# Patient Record
Sex: Female | Born: 1955 | Race: White | Hispanic: No | Marital: Single | Smoking: Never smoker
Health system: Southern US, Community
[De-identification: ages and names within clinical notes are randomized; demographics above are authoritative.]

## PROBLEM LIST (undated history)

## (undated) DIAGNOSIS — E785 Hyperlipidemia, unspecified: Secondary | ICD-10-CM

## (undated) DIAGNOSIS — E039 Hypothyroidism, unspecified: Secondary | ICD-10-CM

## (undated) DIAGNOSIS — M79671 Pain in right foot: Secondary | ICD-10-CM

## (undated) HISTORY — DX: Hypothyroidism, unspecified: E03.9

## (undated) HISTORY — DX: Hyperlipidemia, unspecified: E78.5

## (undated) HISTORY — DX: Pain in right foot: M79.671

---

## 2002-02-23 ENCOUNTER — Encounter: Admission: RE | Admit: 2002-02-23 | Discharge: 2002-05-24 | Payer: Self-pay

## 2003-05-24 ENCOUNTER — Encounter: Payer: Self-pay | Admitting: Chiropractic Medicine

## 2003-05-24 ENCOUNTER — Encounter: Admission: RE | Admit: 2003-05-24 | Discharge: 2003-05-24 | Payer: Self-pay | Admitting: Chiropractic Medicine

## 2008-12-29 ENCOUNTER — Encounter: Admission: RE | Admit: 2008-12-29 | Discharge: 2008-12-29 | Payer: Self-pay | Admitting: Family Medicine

## 2011-05-02 NOTE — Consult Note (Signed)
[place]. [facility]  Patient:    MARG, MACMASTER Visit Number: [id number] MRN: [id number]          Service Type: PMG Location: [facility] Attending Physician:  Sondra Come Dictated by:   Sondra Come, D.O. Proc. Date: 03/24/02 Admit Date:  02/23/2002   CC:         Molly Maduro P. Merla Riches, M.D.   Consultation Report  Ms. Echavarria returns to clinic today as scheduled for reevaluation.  She continues to complain of right anterior foot pain secondary to complex regional pain syndrome which is fairly well controlled with Neurontin. Patient had expressed a desire to come off the Neurontin for some mild intolerable side effects and we tried to transition her to gabitril, although she states that the gabitril did not help relieve her symptoms and she wants to stay on the Neurontin.  She asks if there are any alternatives to this and we discussed this.  I review health and history form and 14 point review of systems.  Patients pain today is a 5/10 on a subjective scale.  Function and quality of life remain essentially the same.  Sleep is fair to good.  PHYSICAL EXAMINATION  GENERAL:  Healthy female in no acute distress.  VITAL SIGNS:  Blood pressure 123/53, pulse 68, respirations 12, O2 saturation 97% on room air.  EXTREMITIES:  No changes in physical examination of the patients right foot. There is minimal tenderness to palpation bilaterally.  There is no dystrophic changes noted at this time.  IMPRESSION:  History of complex regional pain syndrome, right foot, well controlled with Neurontin.  PLAN: 1. Ms. Kuehnle and I discussed further treatment options and alternatives to    Neurontin.  Overall, she states that she is tolerating the Neurontin well    enough to continue with it and wishes to do so at this time.  In the future    consideration may be given to switching to trileptal or ______ if patient    desires.  Other alternatives could include a retrial of  acupuncture,    possibly magnet therapy but there is no proof that this would benefit her.    We also discussed topical application of ______ which would be required    three to four times per day for approximately three to four weeks to    decrease the substance P.  We could also consider local steroid injection    but patient does not desire to proceed with this at this time.  After    discussion patient wishes to continue with the Neurontin 300 mg one to two    p.o. t.i.d. and I have written her two prescriptions, one for 180 pills    which she will mail off.  This has two refills.  I have also written her    for 60 tablets without refills which she will fill today. 2. Patient to return to clinic as needed.  Ms. Crossland may follow up with her    primary care physician if she desires so to continue with the Neurontin.  Patient was educated on the above findings and recommendations and understands.  There were no barriers to communication. Dictated by:   Sondra Come, D.O. Attending Physician:  Sondra Come DD:  03/24/02 TD:  03/24/02 Job: [id number]

## 2011-05-02 NOTE — Consult Note (Signed)
[facility]  Patient:    Sylvia Pruitt, Sylvia Pruitt Visit Number: [id number] MRN: [id number]          Service Type: PMG Location: [facility] Attending Physician:  Sondra Come Dictated by:   Sondra Come, D.O. Proc. Date: 02/24/02 Admit Date:  02/23/2002   CC:         Sylvia Pruitt, M.D.   Consultation Report  NEW PATIENT CONSULTATION  REFERRING Tasheika Kitzmiller:  Sylvia Pruitt, M.D., [facility]  Dear Dr. Merla Pruitt:  Thank you very much for kindly referring Syl[facility].  The patient was seen in our clinic today.  Please refer to the following for details regarding the history, physical examination, and treatment plan.  Once again, thank you for allowing [place] to participate in the care of Sylvia Pruitt.  CHIEF COMPLAINT:  Foot pain.  HISTORY OF PRESENT ILLNESS:  Sylvia Pruitt is a pleasant 55 year old right hand dominant female with a history of complex regional pain syndrome in her right foot following a Morton neuroma resection in 1995 between her second and third metatarsal heads.  She was treated previously by Dr. ______ at [facility] and states she had two injections into her back which as she describes sound like lumbar sympathetic blocks without relief.  These were done in 1996 or 1997; she cannot remember specifically.  She has been treated with medications including Neurontin, Daypro and Procardia.  She has weaned herself from the Daypro and Procardia, and continues taking Neurontin 300 mg t.i.d. to q.i.d. with significant relief.  She states that she was on higher doses of this but has decreased secondary to weight gain.  Even at the current dose of 300 mg t.i.d. to q.i.d., she has noted some weight gain and somnolence and is not as sharp as she typically is, and wants to know if there are alternatives.  She apparently tried Topamax but cannot remember the  dose.  She said after one dose of this, she noted paresthesias and numbness in her hands bilaterally within an hour after taking it and she promptly discontinued this medicine.  This was prescribed by Dr. Loletta Parish at [facility].  Again, she is interested in some alternative medication to Neurontin.  Her pain is a 2/10 on a subjective scale with the medications.  Her function and quality of life indices are fair but sometimes she notes decreased ability to walk secondary to the burning type pain in her feet with prolonged walking.  Her symptoms are worse with walking and typically improved with medications and therapy.  Her pain is described as a burning and tingling feeling on the plantar surface and dorsal surface of her forefoot on the right.  I review health and history form, and 14-point review of systems.  PAST MEDICAL HISTORY: 1. Hypothyroidism. 2. Complex regional pain syndrome.  PAST SURGICAL HISTORY:  Morton neuroma resection.  FAMILY HISTORY:  Noncontributory.  SOCIAL HISTORY:  Denies smoking or alcohol use.  She is single and currently works.  ALLERGIES:  No known drug allergies.  CURRENT MEDICATIONS: 1. Neurontin 300 mg t.i.d. to q.i.d. 2. Synthroid.  PHYSICAL EXAMINATION:  GENERAL:  The patient is a healthy female in no acute distress.  VITAL SIGNS:  Blood pressure 128/70, pulse 67, respirations 18, O2 saturation is 99% on room air.  LOWER EXTREMITIES:  No heat, erythema or edema in the lower extremities including the feet bilaterally.  Range of  motion of the ankles and toes are full bilaterally.  There is a healed incisional scar between the second and third metatarsal heads on the right.  There is no allodynia or hyperpathia noted with light touch to the right foot.  There are no dystrophic changes noted.  NEUROLOGIC:  Manual muscle testing is 5/5 bilateral lower extremities. Sensory exam is intact to light touch bilateral lower extremities.   Muscle stretch reflexes are 2+/4 bilateral patellar, medial hamstrings, and Achilles.  Squeeze test of the right foot is normal.  IMPRESSION: 1. History of complex regional pain syndrome right foot, well controlled with    Neurontin. 2. History of hypothyroidism.  PLAN: 1. I had a thorough discussion with Sylvia Pruitt regarding treatment    alternatives.  Given her undesirable side effect profile with Neurontin,    will transition her over to Gabitril 4 mg up to three times daily.  We will    do this gradually starting with Gabitril 4 mg at bedtime while decreasing    the Neurontin dose to b.i.d.  Will follow this schedule for one to two days    and then add one tablet of Gabitril 4 mg and decreasing by one capsule of    Neurontin until the patient is on t.i.d. dosing. 2. Consider local steroid injection into right foot. 3. The patient is to return to clinic in one month or sooner as needed.  The patient was educated in the above findings and recommendations, and understands.  There were no barriers to communication. Dictated by:   Sondra Come, D.O. Attending Physician:  Sondra Come DD:  02/24/02 TD:  02/25/02 Job: [phone] [id number]

## 2011-11-19 ENCOUNTER — Ambulatory Visit (INDEPENDENT_AMBULATORY_CARE_PROVIDER_SITE_OTHER): Payer: BC Managed Care – PPO

## 2011-11-19 DIAGNOSIS — M79609 Pain in unspecified limb: Secondary | ICD-10-CM

## 2011-11-19 DIAGNOSIS — Z23 Encounter for immunization: Secondary | ICD-10-CM

## 2012-05-11 ENCOUNTER — Other Ambulatory Visit: Payer: Self-pay | Admitting: Family Medicine

## 2012-06-10 ENCOUNTER — Ambulatory Visit (INDEPENDENT_AMBULATORY_CARE_PROVIDER_SITE_OTHER): Payer: BC Managed Care – PPO | Admitting: Physician Assistant

## 2012-06-10 VITALS — BP 113/67 | HR 60 | Temp 98.1°F | Resp 16 | Ht 75.5 in | Wt 170.6 lb

## 2012-06-10 DIAGNOSIS — M79609 Pain in unspecified limb: Secondary | ICD-10-CM

## 2012-06-10 DIAGNOSIS — Z1231 Encounter for screening mammogram for malignant neoplasm of breast: Secondary | ICD-10-CM

## 2012-06-10 DIAGNOSIS — E039 Hypothyroidism, unspecified: Secondary | ICD-10-CM

## 2012-06-10 DIAGNOSIS — M79673 Pain in unspecified foot: Secondary | ICD-10-CM

## 2012-06-10 DIAGNOSIS — E785 Hyperlipidemia, unspecified: Secondary | ICD-10-CM

## 2012-06-10 LAB — COMPREHENSIVE METABOLIC PANEL
ALT: 12 U/L (ref 0–35)
AST: 16 U/L (ref 0–37)
Albumin: 4.5 g/dL (ref 3.5–5.2)
Alkaline Phosphatase: 67 U/L (ref 39–117)
BUN: 14 mg/dL (ref 6–23)
CO2: 28 mEq/L (ref 19–32)
Calcium: 9.7 mg/dL (ref 8.4–10.5)
Chloride: 106 mEq/L (ref 96–112)
Creat: 0.78 mg/dL (ref 0.50–1.10)
Glucose, Bld: 88 mg/dL (ref 70–99)
Potassium: 4.5 mEq/L (ref 3.5–5.3)
Sodium: 142 mEq/L (ref 135–145)
Total Bilirubin: 0.7 mg/dL (ref 0.3–1.2)
Total Protein: 7 g/dL (ref 6.0–8.3)

## 2012-06-10 LAB — LIPID PANEL
Cholesterol: 226 mg/dL — ABNORMAL HIGH (ref 0–200)
HDL: 54 mg/dL (ref >39)
LDL Cholesterol: 160 mg/dL — ABNORMAL HIGH (ref 0–99)
Total CHOL/HDL Ratio: 4.2 Ratio
Triglycerides: 58 mg/dL (ref <150)
VLDL: 12 mg/dL (ref 0–40)

## 2012-06-10 LAB — TSH: TSH: 0.575 u[IU]/mL (ref 0.350–4.500)

## 2012-06-10 MED ORDER — LEVOTHYROXINE SODIUM 75 MCG PO TABS: 75.0000 ug | ORAL_TABLET | Freq: Every day | ORAL | Status: DC

## 2012-06-10 MED ORDER — GABAPENTIN 300 MG PO CAPS: 600.0000 mg | ORAL_CAPSULE | Freq: Four times a day (QID) | ORAL | Status: DC

## 2012-06-10 MED ORDER — TRAMADOL HCL 50 MG PO TABS: 50.0000 mg | ORAL_TABLET | Freq: Three times a day (TID) | ORAL | Status: AC | PRN

## 2012-06-10 NOTE — Progress Notes (Signed)
Patient ID: Sylvia Pruitt MRN: [id number], DOB: March 17, 1956, 56 y.o. Date of Encounter: 06/10/2012, 10:28 AM  Primary Physician: No primary provider on file.  Chief Complaint: Medication refill and lab work  HPI: 56 y.o. year old female with history below presents for medication refill and labs.  1) Hypothyroidism: Doing well. No issues or complaints. Tolerating medicine without issues. Takes daily. Not currently out. Needs refills.  2) Elevated lipids: Mildly elevated fasting lipid panel 10/27/11. Total cholesterol 202, Triglycerides 67, HDL 55, LDL 134. Eats a healthy diet, but she is unable to name anything in office. Regular exercise 5 times per week for 30-50 minutes. Not taking fish oil.   3) Chronic neuropathic right foot pain: Doing well with her Neurontin 300 mg 2 tabs qid. Does have some break through pain just lateral to her Morton's Neuroma excision site. Has seen a podiatrist about 6 month ago and was placed in a boot. This helped, but since she has been out of it her pain has returned. She is not interested in having any procedure done for this.     Past Medical History  Diagnosis Date  . Hypothyroid   . Foot pain, right   . Elevated lipids      Home Meds: Prior to Admission medications   Medication Sig Start Date End Date Taking? Authorizing Provider  gabapentin (NEURONTIN) 300 MG capsule Take 2 capsules (600 mg total) by mouth 4 (four) times daily. 06/10/12  Yes Orell Hurtado M Karmin Kasprzak, PA-C  levothyroxine (SYNTHROID, LEVOTHROID) 75 MCG tablet Take 1 tablet (75 mcg total) by mouth daily. 06/10/12  Yes Gabrial Poppell M Adilynne Fitzwater, PA-C           Allergies: No Known Allergies  History   Social History  . Marital Status: Single    Spouse Name: N/A    Number of Children: N/A  . Years of Education: N/A   Occupational History  . Not on file.   Social History Main Topics  . Smoking status: Never Smoker   . Smokeless tobacco: Not on file  . Alcohol Use: Not on file  . Drug Use: Not on file    . Sexually Active: Not on file   Other Topics Concern  . Not on file   Social History Narrative  . No narrative on file     Review of Systems: Constitutional: negative for chills, fever, night sweats, weight changes, or fatigue  HEENT: negative for vision changes, hearing loss, congestion, rhinorrhea, ST, epistaxis, or sinus pressure Cardiovascular: negative for chest pain or palpitations Respiratory: negative for hemoptysis, wheezing, shortness of breath, or cough Abdominal: negative for abdominal pain, nausea, vomiting, diarrhea, or constipation Dermatological: negative for rash Neurologic: negative for headache, dizziness, or syncope All other systems reviewed and are otherwise negative with the exception to those above and in the HPI.   Physical Exam: Blood pressure 113/67, pulse 60, temperature 98.1 F (36.7 C), temperature source Oral, resp. rate 16, height 6' 3.5" (1.918 m), weight 170 lb 9.6 oz (77.384 kg), SpO2 97.00%., Body mass index is 21.04 kg/(m^2). General: Well developed, well nourished, in no acute distress. Head: Normocephalic, atraumatic, eyes without discharge, sclera non-icteric, nares are without discharge. Bilateral auditory canals clear, TM's are without perforation, pearly grey and translucent with reflective cone of light bilaterally. Oral cavity moist, posterior pharynx without exudate, erythema, peritonsillar abscess, or post nasal drip.  Neck: Supple. No thyromegaly. Full ROM. No lymphadenopathy. Lungs: Clear bilaterally to auscultation without wheezes, rales, or rhonchi. Breathing is  unlabored. Heart: RRR with S1 S2. No murmurs, rubs, or gallops appreciated. Msk:  Strength and tone normal for age. Extremities/Skin: Warm and dry. No clubbing or cyanosis. No edema. No rashes or suspicious lesions. Neuro: Alert and oriented X 3. Moves all extremities spontaneously. Gait is normal. CNII-XII grossly in tact. Psych:  Responds to questions appropriately with a  normal affect.   Labs: CMP, Lipid, and TSH all pending. Patient is fasting.  ASSESSMENT AND PLAN:  56 y.o. year old female with hypothyroidism, elevated lipids, and chronic neuropathic pain of the right foot. 1. Hypothyroidism -Refilled Synthroid 75 mcg 1 po daily #90 RF 3 -Await labs  2. Elevated Cholesterol -Await labs, if remains elevated likely start statin -Continue healthy diet and exercise -Start fish oil  3. Neuropathic foot pain -Chronic issue -Does well with the Neurontin -Refilled Neurontin 300 mg Take 2 tabs qid #720 RF 3 -Trial of Ultram 50 mg 1 po tid prn for break through pain RF 0 -Call with update -If does not improve, may need to go back to the podiatrist   4. MMG -Requests referral for MMG as I walk out of the room completing our visit today -Referral made for screening MMG -She is uncertain when her last MMG was -Does not perform SBE -No family history of breast cancer  Signed, Eula Listen, PA-C 06/10/2012 10:28 AM

## 2012-06-11 ENCOUNTER — Other Ambulatory Visit: Payer: Self-pay | Admitting: Physician Assistant

## 2012-06-11 MED ORDER — ATORVASTATIN CALCIUM 20 MG PO TABS: 20.0000 mg | ORAL_TABLET | Freq: Every day | ORAL | Status: DC

## 2012-06-22 ENCOUNTER — Telehealth: Payer: Self-pay

## 2012-06-22 NOTE — Telephone Encounter (Signed)
PT WOULD LIKE TO COME AND PICK UP A COPY OF HER LABS FROM 06/10/12, PT STATES THAT LABS WERE SUPPOSED TO BE MAILED BUT SHE HAS NOT YET RECEIVED THEM. [phone]

## 2012-06-22 NOTE — Telephone Encounter (Signed)
Done copy is in the pick up drawer per patients request.   Pt is aware that records are ready.

## 2012-07-13 ENCOUNTER — Ambulatory Visit
Admission: RE | Admit: 2012-07-13 | Discharge: 2012-07-13 | Disposition: A | Discharge status: Home / Self Care | Payer: BC Managed Care – PPO | Source: Ambulatory Visit | Attending: Physician Assistant | Admitting: Physician Assistant

## 2012-07-13 ENCOUNTER — Ambulatory Visit: Payer: Self-pay

## 2012-07-13 DIAGNOSIS — Z1231 Encounter for screening mammogram for malignant neoplasm of breast: Secondary | ICD-10-CM

## 2012-07-27 ENCOUNTER — Ambulatory Visit: Payer: Self-pay

## 2012-11-21 ENCOUNTER — Other Ambulatory Visit: Payer: Self-pay | Admitting: Family Medicine

## 2013-03-13 ENCOUNTER — Other Ambulatory Visit: Payer: Self-pay | Admitting: Physician Assistant

## 2013-03-14 ENCOUNTER — Other Ambulatory Visit: Payer: Self-pay | Admitting: Physician Assistant

## 2013-03-29 ENCOUNTER — Ambulatory Visit (INDEPENDENT_AMBULATORY_CARE_PROVIDER_SITE_OTHER): Payer: BC Managed Care – PPO | Admitting: Family Medicine

## 2013-03-29 VITALS — BP 122/76 | HR 49 | Temp 98.2°F | Resp 16 | Ht 71.0 in | Wt 164.0 lb

## 2013-03-29 DIAGNOSIS — M25519 Pain in unspecified shoulder: Secondary | ICD-10-CM

## 2013-03-29 DIAGNOSIS — D1739 Benign lipomatous neoplasm of skin and subcutaneous tissue of other sites: Secondary | ICD-10-CM

## 2013-03-29 DIAGNOSIS — M25511 Pain in right shoulder: Secondary | ICD-10-CM

## 2013-03-29 DIAGNOSIS — G90529 Complex regional pain syndrome I of unspecified lower limb: Secondary | ICD-10-CM | POA: Insufficient documentation

## 2013-03-29 DIAGNOSIS — E039 Hypothyroidism, unspecified: Secondary | ICD-10-CM

## 2013-03-29 DIAGNOSIS — D171 Benign lipomatous neoplasm of skin and subcutaneous tissue of trunk: Secondary | ICD-10-CM

## 2013-03-29 DIAGNOSIS — E785 Hyperlipidemia, unspecified: Secondary | ICD-10-CM

## 2013-03-29 DIAGNOSIS — G90521 Complex regional pain syndrome I of right lower limb: Secondary | ICD-10-CM

## 2013-03-29 LAB — COMPREHENSIVE METABOLIC PANEL
ALT: 13 U/L (ref 0–35)
AST: 15 U/L (ref 0–37)
Albumin: 3.8 g/dL (ref 3.5–5.2)
Alkaline Phosphatase: 64 U/L (ref 39–117)
BUN: 13 mg/dL (ref 6–23)
CO2: 27 mEq/L (ref 19–32)
Calcium: 9.1 mg/dL (ref 8.4–10.5)
Chloride: 108 mEq/L (ref 96–112)
Creat: 0.73 mg/dL (ref 0.50–1.10)
Glucose, Bld: 94 mg/dL (ref 70–99)
Potassium: 4.1 mEq/L (ref 3.5–5.3)
Sodium: 141 mEq/L (ref 135–145)
Total Bilirubin: 0.6 mg/dL (ref 0.3–1.2)
Total Protein: 6.4 g/dL (ref 6.0–8.3)

## 2013-03-29 LAB — LIPID PANEL
Cholesterol: 121 mg/dL (ref 0–200)
HDL: 44 mg/dL (ref >39)
LDL Cholesterol: 62 mg/dL (ref 0–99)
Total CHOL/HDL Ratio: 2.8 Ratio
Triglycerides: 77 mg/dL (ref <150)
VLDL: 15 mg/dL (ref 0–40)

## 2013-03-29 LAB — TSH: TSH: 0.225 u[IU]/mL — ABNORMAL LOW (ref 0.350–4.500)

## 2013-03-29 MED ORDER — MELOXICAM 15 MG PO TABS: 15.0000 mg | ORAL_TABLET | Freq: Every day | ORAL | Status: DC

## 2013-03-29 MED ORDER — ATORVASTATIN CALCIUM 20 MG PO TABS: 20.0000 mg | ORAL_TABLET | Freq: Every day | ORAL | Status: DC

## 2013-03-29 MED ORDER — GABAPENTIN 300 MG PO CAPS: 600.0000 mg | ORAL_CAPSULE | Freq: Three times a day (TID) | ORAL | Status: DC

## 2013-03-29 MED ORDER — LEVOTHYROXINE SODIUM 75 MCG PO TABS: 75.0000 ug | ORAL_TABLET | Freq: Every day | ORAL | Status: DC

## 2013-03-29 NOTE — Patient Instructions (Signed)
Take your medications as directed  If you decide you wish to be referred to a surgeon for the lipoma let me know  Return in one year or sooner if needed  Continue getting exercise

## 2013-03-29 NOTE — Progress Notes (Signed)
Subjective: 57 year old lady who is here for a routine refilling of her medications and to discuss several items. She is on medicines for hypothyroidism, which she's been on for 20 something years. 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 The gabapentin does help to control this. She also has been has problems with pain in her shoulder with marked internal and external rotation it hurts her in the biceps and triceps regions. It doesn't really limit her lifestyle, but it does hurt. The physical therapist has given her some exercises which have helped some. She has just started taking this summer for an old prescription of MOBIC for this. She also has been diagnosed with hyperlipidemia a year ago was placed on Lipitor 20 daily. She has not been back for followup labs since that time. Lastly she has a lipoma on her left flank it she has known about for a long time. Sometime ago she was told these could be removed, and she feels like it has grown a little bit.  Objective: Pleasant alert lady in no major distress. Neck supple without nodes thyromegaly. Chest is clear to auscultation. Heart regular without murmurs. No carotid bruits. She has a lipoma on her left lateral low chest wall, below and lateral to the breast, and that is soft, readily movable, 5 cm or so in diameter.  Assessment: Hypothyroidism Hyperlipidemia Right shoulder/upper arm pains Lipoma left flank Reflex sympathetic dystrophy and pain right foot secondary to old Morton's neuroma  Plan: Resume using the Mobic. Use it continuously for 2 or 3 weeks, then just on when necessary basis and see how the shoulder does. If he keeps bothering her a lot we can get an orthopedist or sports medicine physician to look at it.  Recommend continuing the cholesterol lower medication. Will also keep her on the same dose of thyroid medicine for now.  I  would recommend just living with a lipoma, but if he gets more concerned troubles her she just wants to get rid of it we can refer her to a general surgeon. This could be done in the office setting here, but it is a large for morning to do that in case there complications.

## 2013-07-18 ENCOUNTER — Ambulatory Visit (INDEPENDENT_AMBULATORY_CARE_PROVIDER_SITE_OTHER): Payer: BC Managed Care – PPO | Admitting: Emergency Medicine

## 2013-07-18 VITALS — BP 116/68 | HR 56 | Temp 97.4°F | Resp 18 | Ht 71.0 in | Wt 162.0 lb

## 2013-07-18 DIAGNOSIS — M25511 Pain in right shoulder: Secondary | ICD-10-CM

## 2013-07-18 DIAGNOSIS — G90521 Complex regional pain syndrome I of right lower limb: Secondary | ICD-10-CM

## 2013-07-18 DIAGNOSIS — G90529 Complex regional pain syndrome I of unspecified lower limb: Secondary | ICD-10-CM

## 2013-07-18 DIAGNOSIS — M79609 Pain in unspecified limb: Secondary | ICD-10-CM

## 2013-07-18 DIAGNOSIS — M79671 Pain in right foot: Secondary | ICD-10-CM

## 2013-07-18 MED ORDER — MELOXICAM 15 MG PO TABS: 15.0000 mg | ORAL_TABLET | Freq: Every day | ORAL | Status: DC

## 2013-07-18 MED ORDER — GABAPENTIN 300 MG PO CAPS: 600.0000 mg | ORAL_CAPSULE | Freq: Four times a day (QID) | ORAL | Status: DC

## 2013-07-18 MED ORDER — TRAMADOL HCL 50 MG PO TABS: 50.0000 mg | ORAL_TABLET | Freq: Three times a day (TID) | ORAL | Status: DC | PRN

## 2013-07-18 NOTE — Progress Notes (Signed)
[facility] [address], [place] [address] [phone]  Date:  07/18/2013   Name:  Sylvia Pruitt   DOB:  09-27-1956   MRN:  [id number]  PCP:  No primary provider on file.    Chief Complaint: Foot Pain   History of Present Illness:  Sylvia Pruitt is a 57 y.o. very pleasant female patient who presents with the following:  20 year history of painful foot.  Had surgery for mortons neuroma and developed RSD.  Now has chronic pain and newer pain from an inflammation of the fourth MTP joint. No history of injury or overuse.  Has been taking an old prescription for NSAID and tramadol in addition to gabapentin with moderate relief.  Wants referral to ortho.  No improvement with over the counter medications or other home remedies. Denies other complaint or health concern today.   Patient Active Problem List   Diagnosis Date Noted  . 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 Hypothyroid 03/29/2013  . 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 Other and unspecified hyperlipidemia 03/29/2013    Past Medical History  Diagnosis Date  . Hypothyroid   . Foot pain, right   . Elevated lipids     History reviewed. No pertinent past surgical history.  History  Substance Use Topics  . Smoking status: Never Smoker   . Smokeless tobacco: Not on file  . Alcohol Use: No    Family History  Problem Relation Age of Onset  . Heart disease Father     No Known Allergies  Medication list has been reviewed and updated.  Current Outpatient Prescriptions on File Prior to Visit  Medication Sig Dispense Refill  . atorvastatin (LIPITOR) 20 MG tablet Take 1 tablet (20 mg total) by mouth daily.  90 tablet  3  . gabapentin (NEURONTIN) 300 MG capsule Take 2 capsules (600 mg total) by mouth 3 (three) times daily.  540 capsule  3  . levothyroxine (SYNTHROID, LEVOTHROID) 75 MCG tablet Take 1 tablet (75 mcg total) by mouth daily.  90 tablet  3  . meloxicam (MOBIC) 15 MG tablet Take 1 tablet (15 mg total)  by mouth daily.  30 tablet  1   No current facility-administered medications on file prior to visit.    Review of Systems:  As per HPI, otherwise negative.    Physical Examination: Filed Vitals:   07/18/13 0839  BP: 116/68  Pulse: 56  Temp: 97.4 F (36.3 C)  Resp: 18   Filed Vitals:   07/18/13 0839  Height: 5\' 11"  (1.803 m)  Weight: 162 lb (73.483 kg)   Body mass index is 22.6 kg/(m^2). Ideal Body Weight: Weight in (lb) to have BMI = 25: 178.9   GEN: WDWN, NAD, Non-toxic, Alert & Oriented x 3 HEENT: Atraumatic, Normocephalic.  Ears and Nose: No external deformity. EXTR: No clubbing/cyanosis/edema NEURO: Normal gait.  PSYCH: Normally interactive. Conversant. Not depressed or anxious appearing.  Calm demeanor.  RIGHT foot:  Surgical scar between second and third toes.  Tender fourth MTP joint.  No deformity or ecchymosis.  Assessment and Plan: RSD Refill mobic Ortho referral  Signed,  Phillips Odor, MD

## 2013-11-09 ENCOUNTER — Ambulatory Visit (INDEPENDENT_AMBULATORY_CARE_PROVIDER_SITE_OTHER): Payer: BC Managed Care – PPO | Admitting: Family Medicine

## 2013-11-09 VITALS — BP 110/68 | HR 70 | Temp 98.4°F | Resp 18 | Ht 71.0 in | Wt 165.0 lb

## 2013-11-09 DIAGNOSIS — N390 Urinary tract infection, site not specified: Secondary | ICD-10-CM

## 2013-11-09 DIAGNOSIS — N760 Acute vaginitis: Secondary | ICD-10-CM

## 2013-11-09 DIAGNOSIS — R32 Unspecified urinary incontinence: Secondary | ICD-10-CM

## 2013-11-09 LAB — POCT UA - MICROSCOPIC ONLY
Casts, Ur, LPF, POC: NEGATIVE
Crystals, Ur, HPF, POC: NEGATIVE
Mucus, UA: POSITIVE
RBC, urine, microscopic: NEGATIVE
WBC, Ur, HPF, POC: NEGATIVE
Yeast, UA: POSITIVE

## 2013-11-09 LAB — POCT URINALYSIS DIPSTICK
Bilirubin, UA: NEGATIVE
Blood, UA: NEGATIVE
Glucose, UA: NEGATIVE
Ketones, UA: NEGATIVE
Leukocytes, UA: NEGATIVE
Nitrite, UA: NEGATIVE
Protein, UA: NEGATIVE
Spec Grav, UA: 1.025
Urobilinogen, UA: 0.2
pH, UA: 5.5

## 2013-11-09 LAB — POCT WET PREP WITH KOH
KOH Prep POC: POSITIVE
RBC Wet Prep HPF POC: NEGATIVE
Trichomonas, UA: NEGATIVE
Yeast Wet Prep HPF POC: NEGATIVE

## 2013-11-09 MED ORDER — FLUCONAZOLE 100 MG PO TABS: 100.0000 mg | ORAL_TABLET | Freq: Every day | ORAL | Status: DC

## 2013-11-09 NOTE — Patient Instructions (Signed)
Diflucan 100 mg daily for 3 days  Return if not improving  Drink plenty of fluids

## 2013-11-09 NOTE — Progress Notes (Signed)
Subjective:  Subjective: 57 year old lady who's been having some pelvic discomfort over the last 5 or 6 days with a sensation of pain when sitting in certain positions. The last couple days she's felt like she was not voiding adequately. She has had a yeast infection some time ago in the past, and she had taken antibiotics, and she wondered whether or something like that this time. She's not sexually involved.  Objective: Pleasant alert healthy appearing lady in no major distress. No CVA tenderness. Abdomen was soft without masses or tenderness. External genitalia normal in appearance. Vaginal mucosa has a whitish discharge, no clumps were noted. No blood noted. Vagina and cervix appear normal. Digital exam reveals no masses. Uterus is slightly tilted left and unremarkable.   Results for orders placed in visit on 11/09/13  POCT UA - MICROSCOPIC ONLY      Result Value Range   WBC, Ur, HPF, POC neg     RBC, urine, microscopic neg     Bacteria, U Microscopic 1+     Mucus, UA positive     Epithelial cells, urine per micros 10-15     Crystals, Ur, HPF, POC neg     Casts, Ur, LPF, POC neg     Yeast, UA positive    POCT URINALYSIS DIPSTICK      Result Value Range   Color, UA yellow     Clarity, UA cloudy     Glucose, UA neg     Bilirubin, UA neg     Ketones, UA neg     Spec Grav, UA 1.025     Blood, UA neg     pH, UA 5.5     Protein, UA neg     Urobilinogen, UA 0.2     Nitrite, UA neg     Leukocytes, UA Negative    POCT WET PREP WITH KOH      Result Value Range   Trichomonas, UA Negative     Clue Cells Wet Prep HPF POC 1-4     Epithelial Wet Prep HPF POC 1-4     Yeast Wet Prep HPF POC neg     Bacteria Wet Prep HPF POC 2+     RBC Wet Prep HPF POC neg     WBC Wet Prep HPF POC 0-1     KOH Prep POC Positive     Assessment: Monilia UTI  Plan: Diflucan 100 mg daily for 3 days  Return if not improving  Drink plenty of fluids

## 2013-11-11 ENCOUNTER — Telehealth: Payer: Self-pay

## 2013-11-11 NOTE — Telephone Encounter (Signed)
Pt was seen on Wednesday and still not better and would like to talk with someone

## 2013-11-11 NOTE — Telephone Encounter (Signed)
I am uncertain what to do differently. She should give the diflucan a couple of days and get rechecked if symptoms persisit.

## 2013-11-11 NOTE — Telephone Encounter (Signed)
Thanks. Called advised to return to clinic tomorrow if not well.

## 2013-11-11 NOTE — Telephone Encounter (Signed)
Agreed -

## 2013-11-11 NOTE — Telephone Encounter (Signed)
Dr Alwyn Ren wants her to return to clinic if not better. Called to advise. She indicates she now has vaginal burning. She states she is out of town will be back tomorrow. She wants to know if you can recommend anything else. I did advise her to push fluids, use medications.

## 2013-11-13 ENCOUNTER — Ambulatory Visit (INDEPENDENT_AMBULATORY_CARE_PROVIDER_SITE_OTHER): Payer: BC Managed Care – PPO | Admitting: Emergency Medicine

## 2013-11-13 VITALS — BP 102/68 | HR 64 | Temp 98.4°F | Resp 18 | Ht 71.0 in | Wt 165.0 lb

## 2013-11-13 DIAGNOSIS — N952 Postmenopausal atrophic vaginitis: Secondary | ICD-10-CM

## 2013-11-13 DIAGNOSIS — N898 Other specified noninflammatory disorders of vagina: Secondary | ICD-10-CM

## 2013-11-13 DIAGNOSIS — R102 Pelvic and perineal pain: Secondary | ICD-10-CM

## 2013-11-13 DIAGNOSIS — N949 Unspecified condition associated with female genital organs and menstrual cycle: Secondary | ICD-10-CM

## 2013-11-13 DIAGNOSIS — R350 Frequency of micturition: Secondary | ICD-10-CM

## 2013-11-13 LAB — POCT WET PREP WITH KOH
Clue Cells Wet Prep HPF POC: NEGATIVE
KOH Prep POC: NEGATIVE
Trichomonas, UA: NEGATIVE
Yeast Wet Prep HPF POC: NEGATIVE

## 2013-11-13 LAB — POCT URINALYSIS DIPSTICK
Bilirubin, UA: NEGATIVE
Blood, UA: NEGATIVE
Glucose, UA: NEGATIVE
Ketones, UA: NEGATIVE
Leukocytes, UA: NEGATIVE
Nitrite, UA: NEGATIVE
Protein, UA: NEGATIVE
Spec Grav, UA: 1.02
Urobilinogen, UA: 0.2
pH, UA: 5.5

## 2013-11-13 LAB — POCT UA - MICROSCOPIC ONLY
Casts, Ur, LPF, POC: NEGATIVE
Crystals, Ur, HPF, POC: NEGATIVE
Yeast, UA: NEGATIVE

## 2013-11-13 LAB — GLUCOSE, POCT (MANUAL RESULT ENTRY): POC Glucose: 102 mg/dl — AB (ref 70–99)

## 2013-11-13 MED ORDER — ESTROGENS, CONJUGATED 0.625 MG/GM VA CREA: TOPICAL_CREAM | VAGINAL | Status: DC

## 2013-11-13 NOTE — Progress Notes (Signed)
Subjective:  This chart was scribed for Sylvia Chris, MD by Carl Best, Medical Scribe. This patient was seen in Room 10 and the patient's care was started at 8:23 AM.   Patient ID: Sylvia Pruitt, female    DOB: Jul 05, 1956, 57 y.o.   MRN: [id number]  HPI HPI Comments: Sylvia Pruitt is a 57 y.o. female who presents to the [facility] complaining of constant vaginal discomfort that started on Wednesday.  She states that the discomfort feels like swelling in her vaginal area.  The patient states that she saw Dr. Alwyn Ren at [facility] on Wednesday upon the onset of her symptoms.  She states that she had a UA performed which revealed yeast in her urine.  She states that she was prescribed 3 days worth of Diflucan and experienced mild relief to her symptoms.  She states that on Friday she experienced urgency every time she drank something and dysuria.  She states that her symptoms subsided yesterday.  She denies vaginal itching as an associated symptom.  She lists mild vaginal discharge as an associated symptom.  She denies having a history of UTIs and experiencing these symptoms before.  She states that she has had a yeast infection in the past but experienced complete relief to her symptoms after she took Diflucan.  She denies a history of GYN surgery.  She denies being sexually active now.  She states that it has been 7 years since she started menopause.  She states that she drank apple cider that was way past its expiration date and the bottom of the jar was fuzzy.     Past Medical History  Diagnosis Date  . Hypothyroid   . Foot pain, right   . Elevated lipids    History reviewed. No pertinent past surgical history.  Family History  Problem Relation Age of Onset  . Heart disease Father    History   Social History  . Marital Status: Single    Spouse Name: N/A    Number of Children: N/A  . Years of Education: N/A   Occupational History  . Not on file.   Social History  Main Topics  . Smoking status: Never Smoker   . Smokeless tobacco: Not on file  . Alcohol Use: No  . Drug Use: No  . Sexual Activity: Not on file   Other Topics Concern  . Not on file   Social History Narrative  . No narrative on file   No Known Allergies  Current outpatient prescriptions:atorvastatin (LIPITOR) 20 MG tablet, Take 1 tablet (20 mg total) by mouth daily., Disp: 90 tablet, Rfl: 3;  fluconazole (DIFLUCAN) 100 MG tablet, Take 1 tablet (100 mg total) by mouth daily., Disp: 3 tablet, Rfl: 1;  gabapentin (NEURONTIN) 300 MG capsule, Take 2 capsules (600 mg total) by mouth 4 (four) times daily., Disp: 720 capsule, Rfl: 3 levothyroxine (SYNTHROID, LEVOTHROID) 75 MCG tablet, Take 1 tablet (75 mcg total) by mouth daily., Disp: 90 tablet, Rfl: 3;  traMADol (ULTRAM) 50 MG tablet, Take 1 tablet (50 mg total) by mouth every 8 (eight) hours as needed for pain., Disp: 30 tablet, Rfl: 0;  meloxicam (MOBIC) 15 MG tablet, Take 1 tablet (15 mg total) by mouth daily., Disp: 30 tablet, Rfl: 1   Review of Systems  Genitourinary: Positive for dysuria, urgency, vaginal discharge and vaginal pain.  All other systems reviewed and are negative.      Objective:  Physical Exam Physical Exam  Nursing  note and vitals reviewed. Constitutional: He is oriented to person, place, and time. He appears well-developed and well-nourished. No distress.  HENT:  Head: Normocephalic.  Eyes: EOM are normal. Pupils are equal, round, and reactive to light.  Neck: Normal range of motion. Neck supple.  Cardiovascular: Normal rate, regular rhythm and normal heart sounds.  Exam reveals no gallop and no friction rub.   No murmur heard. Pulmonary/Chest: Effort normal and breath sounds normal. No respiratory distress. He has no wheezes. He has no rales.  Abdominal: Soft.  Genitourinary/Anorectal: Normal external genitalia, white-ish discharge on the vaginal wall, cervix appeared normal, pap smear was done, uterus was  not enlarged, no annexal masses  Neurological: He is alert and oriented to person, place, and time.  Skin: Skin is warm and dry.   Results for orders placed in visit on 11/13/13  POCT URINALYSIS DIPSTICK      Result Value Range   Color, UA yellow     Clarity, UA hazy     Glucose, UA neg     Bilirubin, UA neg     Ketones, UA neg     Spec Grav, UA 1.020     Blood, UA neg     pH, UA 5.5     Protein, UA neg     Urobilinogen, UA 0.2     Nitrite, UA neg     Leukocytes, UA Negative    POCT UA - MICROSCOPIC ONLY      Result Value Range   WBC, Ur, HPF, POC 2-4     RBC, urine, microscopic 0-2     Bacteria, U Microscopic 1+     Mucus, UA 2+     Epithelial cells, urine per micros 15-20     Crystals, Ur, HPF, POC neg     Casts, Ur, LPF, POC neg     Yeast, UA neg    GLUCOSE, POCT (MANUAL RESULT ENTRY)      Result Value Range   POC Glucose 102 (*) 70 - 99 mg/dl  POCT WET PREP WITH KOH      Result Value Range   Trichomonas, UA Negative     Clue Cells Wet Prep HPF POC neg     Epithelial Wet Prep HPF POC 4-8     Yeast Wet Prep HPF POC negative     Bacteria Wet Prep HPF POC 1+     RBC Wet Prep HPF POC 0-1     WBC Wet Prep HPF POC 2-3     KOH Prep POC Negative        Assessment & Plan:  Patient will be on Premarin cream. A urine culture was done. We'll schedule an ultrasound of the pelvis. I personally performed the services described in this documentation, which was scribed in my presence. The recorded information has been reviewed and is accurate.

## 2013-11-14 ENCOUNTER — Other Ambulatory Visit: Payer: Self-pay | Admitting: Radiology

## 2013-11-14 DIAGNOSIS — R102 Pelvic and perineal pain: Secondary | ICD-10-CM

## 2013-11-14 LAB — PAP IG (IMAGE GUIDED)

## 2013-11-14 LAB — URINE CULTURE
Colony Count: NO GROWTH
Organism ID, Bacteria: NO GROWTH

## 2013-11-15 ENCOUNTER — Telehealth: Payer: Self-pay | Admitting: Family Medicine

## 2013-11-15 NOTE — Telephone Encounter (Signed)
Agree that pt should RTC for further eval as she was treated 1 wk ago and completed medication as directed.  Is she using the estrogen cream that Dr. Cleta Alberts prescribed?

## 2013-11-15 NOTE — Telephone Encounter (Signed)
Pt called for lab results. Advised her of normal labs per note. She is still having symptoms even though she finished her Diflucan. I advised her to come in for further eval.

## 2013-11-16 NOTE — Telephone Encounter (Signed)
Left message for her, to call back, so I can find out. She may need to give this more time.

## 2013-11-17 ENCOUNTER — Ambulatory Visit
Admission: RE | Admit: 2013-11-17 | Discharge: 2013-11-17 | Disposition: A | Discharge status: Home / Self Care | Payer: BC Managed Care – PPO | Source: Ambulatory Visit | Attending: Emergency Medicine | Admitting: Emergency Medicine

## 2013-11-17 DIAGNOSIS — R102 Pelvic and perineal pain: Secondary | ICD-10-CM

## 2013-11-17 DIAGNOSIS — R350 Frequency of micturition: Secondary | ICD-10-CM

## 2013-11-17 DIAGNOSIS — N949 Unspecified condition associated with female genital organs and menstrual cycle: Secondary | ICD-10-CM

## 2013-11-17 DIAGNOSIS — N898 Other specified noninflammatory disorders of vagina: Secondary | ICD-10-CM

## 2013-11-17 NOTE — Telephone Encounter (Signed)
This was sent to me specifically, but seems to be handled.  Do you need me to address something?
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
## 2013-11-18 ENCOUNTER — Other Ambulatory Visit: Payer: BC Managed Care – PPO

## 2013-11-18 ENCOUNTER — Other Ambulatory Visit: Payer: Self-pay

## 2013-11-18 DIAGNOSIS — R9389 Abnormal findings on diagnostic imaging of other specified body structures: Secondary | ICD-10-CM

## 2013-11-18 NOTE — Telephone Encounter (Signed)
Done

## 2013-11-18 NOTE — Telephone Encounter (Signed)
I called and spoke with patient. Her ultrasound is abnormal and shows a enlargement in the endometrial tissue. Referral has been made for evaluation to GYN.

## 2013-11-21 ENCOUNTER — Telehealth: Payer: Self-pay | Admitting: Radiology

## 2013-11-21 NOTE — Telephone Encounter (Signed)
Message copied by Caffie Damme on Mon Nov 21, 2013  3:27 PM ------      Message from: Lesle Chris A      Created: Fri Nov 18, 2013  9:03 AM       Please call patient let her know her ultrasound is abnormal. She is thickening of the endometrium. This possibly could be from a serious problem such as endometrial cancer. We need to get her in to see the GYN next week. Make referral for patient to see Dr. Rosemary Holms . ------

## 2013-11-21 NOTE — Telephone Encounter (Signed)
Called left message for her to call me back.  

## 2013-11-21 NOTE — Telephone Encounter (Signed)
Sylvia Pruitt has spoken to the patient. Dr Cleta Alberts did call her on Friday. Lupita Leash sent referral to Dr Billy Coast.

## 2013-11-25 ENCOUNTER — Telehealth: Payer: Self-pay | Admitting: Radiology

## 2013-11-25 NOTE — Telephone Encounter (Signed)
Spoke to Deanna at Dr Billy Coast office faxed records to her at [phone], Jennette Kettle will call patient with an appointment. Can you tell what happened with the referral?

## 2014-04-05 ENCOUNTER — Ambulatory Visit: Payer: BC Managed Care – PPO

## 2014-04-05 ENCOUNTER — Ambulatory Visit (INDEPENDENT_AMBULATORY_CARE_PROVIDER_SITE_OTHER): Payer: BC Managed Care – PPO | Admitting: Family Medicine

## 2014-04-05 VITALS — BP 120/68 | HR 51 | Temp 99.0°F | Resp 18 | Ht 69.0 in | Wt 159.8 lb

## 2014-04-05 DIAGNOSIS — R1110 Vomiting, unspecified: Secondary | ICD-10-CM

## 2014-04-05 DIAGNOSIS — R109 Unspecified abdominal pain: Secondary | ICD-10-CM

## 2014-04-05 LAB — POCT CBC
Granulocyte percent: 77.1 %G (ref 37–80)
HCT, POC: 37.7 % (ref 37.7–47.9)
Hemoglobin: 12 g/dL — AB (ref 12.2–16.2)
Lymph, poc: 1.9 (ref 0.6–3.4)
MCH, POC: 30.6 pg (ref 27–31.2)
MCHC: 31.8 g/dL (ref 31.8–35.4)
MCV: 96.2 fL (ref 80–97)
MID (cbc): 0.6 (ref 0–0.9)
MPV: 9.4 fL (ref 0–99.8)
POC Granulocyte: 8.5 — AB (ref 2–6.9)
POC LYMPH PERCENT: 17 %L (ref 10–50)
POC MID %: 5.9 %M (ref 0–12)
Platelet Count, POC: 224 10*3/uL (ref 142–424)
RBC: 3.92 M/uL — AB (ref 4.04–5.48)
RDW, POC: 13.7 %
WBC: 11 10*3/uL — AB (ref 4.6–10.2)

## 2014-04-05 LAB — POCT URINALYSIS DIPSTICK
Bilirubin, UA: NEGATIVE
Glucose, UA: NEGATIVE
Ketones, UA: 185
Nitrite, UA: NEGATIVE
Protein, UA: 30
Spec Grav, UA: 1.025
Urobilinogen, UA: 0.2
pH, UA: 5.5

## 2014-04-05 MED ORDER — ONDANSETRON 4 MG PO TBDP
4.0000 mg | ORAL_TABLET | Freq: Once | ORAL | Status: AC
  Administered 2014-04-05: 4 mg via ORAL

## 2014-04-05 MED ORDER — NALBUPHINE HCL 10 MG/ML IJ SOLN
10.0000 mg | INTRAMUSCULAR | Status: DC | PRN
  Administered 2014-04-05: 10 mg via INTRAMUSCULAR

## 2014-04-05 MED ORDER — ONDANSETRON HCL 8 MG PO TABS: 8.0000 mg | ORAL_TABLET | Freq: Three times a day (TID) | ORAL | Status: DC | PRN

## 2014-04-05 MED ORDER — CIPROFLOXACIN HCL 500 MG PO TABS: 500.0000 mg | ORAL_TABLET | Freq: Two times a day (BID) | ORAL | Status: DC

## 2014-04-05 MED ORDER — TRAMADOL HCL 50 MG PO TABS: 50.0000 mg | ORAL_TABLET | Freq: Three times a day (TID) | ORAL | Status: DC | PRN

## 2014-04-05 NOTE — Progress Notes (Signed)
   Subjective:    Patient ID: Sylvia Pruitt, female    DOB: 1956-09-17, 58 y.o.   MRN: [id number]  HPI Patient presents for complaint of illness. Started getting right flank pain that was severe last night. Pain would increase and then she would vomit and pain would subside. This happened in recurrent episodes. Now a dull ache. No burning with urination, urgency, hesistancy, hematuria. No history of kidney stones. Never had similar symptoms before. No diarrhea or constipation. No history of abdominal surgery. No alcohol. No history of bowel obstruction. No fever, positive chills. Feels like she is burping a lot. Not passing gas. Last BM last night. No abdominal bloating or distension. No chest pain, SOB, cough, history of cardiac problems. No hematemesis or coffee ground emesis. No biliary emesis. No melena or hematochezia. Last episode of emesis was 9am. Feeling better now than did last night.  Review of Systems  Constitutional: Positive for chills, activity change, appetite change and fatigue. Negative for fever.  HENT: Negative.   Respiratory: Negative.   Cardiovascular: Negative.   All other systems reviewed and are negative.     Objective:   Physical Exam  Constitutional: She is oriented to person, place, and time. She appears well-developed and well-nourished. She appears distressed.  HENT:  Head: Normocephalic and atraumatic.  Mouth/Throat: Oropharynx is clear and moist. No oropharyngeal exudate.  Eyes: Conjunctivae are normal. Pupils are equal, round, and reactive to light. No scleral icterus.  Neck: Normal range of motion. Neck supple.  Cardiovascular: Normal rate, regular rhythm, normal heart sounds and intact distal pulses.   No murmur heard. Pulmonary/Chest: Effort normal and breath sounds normal. No respiratory distress. She has no wheezes. She has no rales.  Abdominal: Soft. She exhibits no distension and no mass. There is no tenderness. There is no rebound and no guarding.    Musculoskeletal: Normal range of motion.  Lymphadenopathy:    She has no cervical adenopathy.  Neurological: She is alert and oriented to person, place, and time.  Skin: Skin is warm and dry. She is not diaphoretic.  Psychiatric: She has a normal mood and affect. Her behavior is normal. Thought content normal.  Right CVA tenderness  [facility] reading (PRIMARY) by  Dr. Maryln Gottron. Non specific bowel gas pattern. No air fluid levels.    Assessment & Plan:  #1. Right flank pain and vomiting. DDx includes nephrolithiasis, pyelo, hepatitis, pancreatitis, appendicitis, bowel obstruction. No RLQ TTP so appendicitis less likely. No abdominal TTP so pancreatitis less likely and no clear risk factor. Exam reassuring against bowel obstruction.  - Labs: UA and culture, CBC, CMP, lipase. CBC shows WBC 11. 0, borderline, may be stress reaction vs. Early infection. UA also borderline with some leuks and blood - Abdominal xray: Neg for obstruction. - Discharge with strict return precautions - Followup for recheck with Dr. Carlota Raspberry tomorrow - Cipro for possible pyelo - Urine strainer for stone - Tramadol and zofran prn

## 2014-04-05 NOTE — Patient Instructions (Addendum)
Thank you for coming in today  1. Take tramadol as needed for pain 2. Take zofran as needed for nausea 3. Start cipro antibiotic 2x per day 4. Followup with Dr. Carlota Raspberry tomorrow at lunch time 5. Go to ER or call the office for worsening symptoms

## 2014-04-06 ENCOUNTER — Ambulatory Visit (INDEPENDENT_AMBULATORY_CARE_PROVIDER_SITE_OTHER): Payer: BC Managed Care – PPO | Admitting: Family Medicine

## 2014-04-06 VITALS — BP 118/64 | HR 59 | Temp 98.2°F | Resp 16 | Wt 160.0 lb

## 2014-04-06 DIAGNOSIS — R109 Unspecified abdominal pain: Secondary | ICD-10-CM

## 2014-04-06 DIAGNOSIS — R319 Hematuria, unspecified: Secondary | ICD-10-CM

## 2014-04-06 DIAGNOSIS — D72829 Elevated white blood cell count, unspecified: Secondary | ICD-10-CM

## 2014-04-06 LAB — POCT CBC
Granulocyte percent: 74.8 %G (ref 37–80)
HCT, POC: 40.6 % (ref 37.7–47.9)
Hemoglobin: 12.8 g/dL (ref 12.2–16.2)
Lymph, poc: 1.7 (ref 0.6–3.4)
MCH, POC: 30.4 pg (ref 27–31.2)
MCHC: 31.5 g/dL — AB (ref 31.8–35.4)
MCV: 96.4 fL (ref 80–97)
MID (cbc): 0.5 (ref 0–0.9)
MPV: 9.3 fL (ref 0–99.8)
POC Granulocyte: 6.5 (ref 2–6.9)
POC LYMPH PERCENT: 19.3 %L (ref 10–50)
POC MID %: 5.9 %M (ref 0–12)
Platelet Count, POC: 238 10*3/uL (ref 142–424)
RBC: 4.21 M/uL (ref 4.04–5.48)
RDW, POC: 13 %
WBC: 8.7 10*3/uL (ref 4.6–10.2)

## 2014-04-06 LAB — COMPREHENSIVE METABOLIC PANEL
ALT: 12 U/L (ref 0–35)
AST: 16 U/L (ref 0–37)
Albumin: 3.8 g/dL (ref 3.5–5.2)
Alkaline Phosphatase: 62 U/L (ref 39–117)
BUN: 21 mg/dL (ref 6–23)
CO2: 27 mEq/L (ref 19–32)
Calcium: 8.8 mg/dL (ref 8.4–10.5)
Chloride: 104 mEq/L (ref 96–112)
Creat: 1.37 mg/dL — ABNORMAL HIGH (ref 0.50–1.10)
Glucose, Bld: 101 mg/dL — ABNORMAL HIGH (ref 70–99)
Potassium: 3.3 mEq/L — ABNORMAL LOW (ref 3.5–5.3)
Sodium: 140 mEq/L (ref 135–145)
Total Bilirubin: 0.8 mg/dL (ref 0.2–1.2)
Total Protein: 6 g/dL (ref 6.0–8.3)

## 2014-04-06 LAB — LIPASE: Lipase: 17 U/L (ref 0–75)

## 2014-04-06 NOTE — Progress Notes (Signed)
Subjective:    Patient ID: Sylvia Pruitt, female    DOB: 02/18/1956, 58 y.o.   MRN: [id number]  HPI Sylvia Pruitt is a 58 y.o. female  Here for follow up from visit last night with Dr. Maryln Gottron. See her note for details, but in brief - started night prior with episodes of increasing R sided flank pain, then N/V. Noted small hematuria and borderline leukocytosis on CBC last night. Suspected possible nephrolith vs early pyelo with LE on U/A.  Started on Cipro 500mg  BID.   CMP, lipase and urine cx pending. No concerning findings on abdominal XR report: FINDINGS:  No disproportionate dilatation of bowel. No free intraperitoneal  gas. Now air-fluid levels. Prominent stool burden throughout the  colon  IMPRESSION:  Nonobstructive bowel gas pattern.  Rested well overnight - able to sleep without pain. 50% better today. Some bouts of soreness into R back and flank this morning. Improved with tramadol once. Did take zofran this morning. No vomiting today.  No fevers. No dysuria, hematuria, or urgency.  4-5 UOP today - using screen - no stones passed. S/p 2 doses of Cipro. No new symptoms other than slight headache today. No weakness or vision changes. Fatigued.   Last BM 2 nights ago - hard stool then. No diarrhea.         Results for orders placed in visit on 04/05/14  POCT CBC      Result Value Ref Range   WBC 11.0 (*) 4.6 - 10.2 K/uL   Lymph, poc 1.9  0.6 - 3.4   POC LYMPH PERCENT 17.0  10 - 50 %L   MID (cbc) 0.6  0 - 0.9   POC MID % 5.9  0 - 12 %M   POC Granulocyte 8.5 (*) 2 - 6.9   Granulocyte percent 77.1  37 - 80 %G   RBC 3.92 (*) 4.04 - 5.48 M/uL   Hemoglobin 12.0 (*) 12.2 - 16.2 g/dL   HCT, POC 37.7  37.7 - 47.9 %   MCV 96.2  80 - 97 fL   MCH, POC 30.6  27 - 31.2 pg   MCHC 31.8  31.8 - 35.4 g/dL   RDW, POC 13.7     Platelet Count, POC 224  142 - 424 K/uL   MPV 9.4  0 - 99.8 fL  POCT URINALYSIS DIPSTICK      Result Value Ref Range   Color, UA yellow     Clarity, UA  hazy     Glucose, UA neg     Bilirubin, UA neg     Ketones, UA 185     Spec Grav, UA 1.025     Blood, UA small     pH, UA 5.5     Protein, UA 30     Urobilinogen, UA 0.2     Nitrite, UA neg     Leukocytes, UA small (1+)        Patient Active Problem List   Diagnosis Date Noted  . 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 Hypothyroid 03/29/2013  . 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 Other and unspecified hyperlipidemia 03/29/2013   Past Medical History  Diagnosis Date  . Hypothyroid   . Foot pain, right   . Elevated lipids    History reviewed. No pertinent past surgical history. No Known Allergies Prior to Admission medications   Medication Sig Start Date End Date Taking? Authorizing Provider  atorvastatin (LIPITOR) 20 MG tablet Take 1 tablet (20 mg  total) by mouth daily. 03/29/13  Yes Posey Boyer, MD  ciprofloxacin (CIPRO) 500 MG tablet Take 1 tablet (500 mg total) by mouth 2 (two) times daily. 04/05/14  Yes Duane Boston, MD  conjugated estrogens (PREMARIN) vaginal cream Apply one half applicator at bedtime for one week then decrease to one half applicator every other night 11/13/13  Yes Darlyne Russian, MD  fluconazole (DIFLUCAN) 100 MG tablet Take 1 tablet (100 mg total) by mouth daily. 11/09/13  Yes Posey Boyer, MD  gabapentin (NEURONTIN) 300 MG capsule Take 2 capsules (600 mg total) by mouth 4 (four) times daily. 07/18/13  Yes Ellison Carwin, MD  levothyroxine (SYNTHROID, LEVOTHROID) 75 MCG tablet Take 1 tablet (75 mcg total) by mouth daily. 03/29/13  Yes Posey Boyer, MD  meloxicam (MOBIC) 15 MG tablet Take 1 tablet (15 mg total) by mouth daily. 07/18/13  Yes Ellison Carwin, MD  ondansetron (ZOFRAN) 8 MG tablet Take 1 tablet (8 mg total) by mouth every 8 (eight) hours as needed for nausea or vomiting. 04/05/14  Yes Duane Boston, MD  traMADol (ULTRAM) 50 MG tablet Take 1 tablet (50 mg total) by mouth every 8 (eight) hours as needed. 04/05/14  Yes Duane Boston, MD   History   Social History   . Marital Status: Single    Spouse Name: N/A    Number of Children: N/A  . Years of Education: N/A   Occupational History  . Not on file.   Social History Main Topics  . Smoking status: Never Smoker   . Smokeless tobacco: Never Used  . Alcohol Use: No  . Drug Use: No  . Sexual Activity: Not on file   Other Topics Concern  . Not on file   Social History Narrative  . No narrative on file      Review of Systems  Constitutional: Negative for fever and chills.  Gastrointestinal: Positive for nausea. Negative for vomiting, diarrhea, constipation, blood in stool and abdominal distention.  Genitourinary: Positive for flank pain. Negative for dysuria, urgency, frequency, hematuria, decreased urine volume, enuresis, difficulty urinating and pelvic pain.  Skin: Negative for rash.       Objective:   Physical Exam  Vitals reviewed. Constitutional: She is oriented to person, place, and time. She appears well-developed and well-nourished. No distress.  HENT:  Head: Normocephalic and atraumatic.  Neck: Carotid bruit is not present.  Cardiovascular: Normal rate, regular rhythm, normal heart sounds and intact distal pulses.   Pulmonary/Chest: Effort normal and breath sounds normal.  Abdominal: Soft. Bowel sounds are normal. She exhibits no distension and no pulsatile midline mass. There is no tenderness. There is no rebound, no guarding and no CVA tenderness (no cvat, but identifies R flank as area of soreness. ). No hernia.  Neurological: She is alert and oriented to person, place, and time.  Skin: Skin is warm and dry. No rash noted. She is not diaphoretic.  Psychiatric: She has a normal mood and affect. Her behavior is normal.   Filed Vitals:   04/06/14 1233  BP: 118/64  Pulse: 59  Temp: 98.2 F (36.8 C)  TempSrc: Oral  Resp: 16  Weight: 160 lb (72.576 kg)  SpO2: 99%    Results for orders placed in visit on 04/06/14  POCT CBC      Result Value Ref Range   WBC 8.7  4.6  - 10.2 K/uL   Lymph, poc 1.7  0.6 - 3.4   POC LYMPH PERCENT  19.3  10 - 50 %L   MID (cbc) 0.5  0 - 0.9   POC MID % 5.9  0 - 12 %M   POC Granulocyte 6.5  2 - 6.9   Granulocyte percent 74.8  37 - 80 %G   RBC 4.21  4.04 - 5.48 M/uL   Hemoglobin 12.8  12.2 - 16.2 g/dL   HCT, POC 40.6  37.7 - 47.9 %   MCV 96.4  80 - 97 fL   MCH, POC 30.4  27 - 31.2 pg   MCHC 31.5 (*) 31.8 - 35.4 g/dL   RDW, POC 13.0     Platelet Count, POC 238  142 - 424 K/uL   MPV 9.3  0 - 99.8 fL        Assessment & Plan:  Sylvia Pruitt is a 58 y.o. female Flank pain - Plan: POCT CBC  Hematuria - Plan: POCT CBC  Leukocytosis, unspecified - Plan: POCT CBC  Suspected nephrolith with colicky pain and hematuria on U/a yesterday. Improved CBC. Improved pain today and tolerating po fluids. Will continue Cipro until urine cx returned and CMP, lipase pending. If not improved or stone passed in 2 days - would consider CT at that time.  Depending on sx's - may be able to order without OV.  Increased stool on XR/constipation. Colace for stool softener. May need miralax if not improving bowel regimen.   No orders of the defined types were placed in this encounter.   Patient Instructions  Your blood count has improved. Continue sips of fluids frequently, can try soups later today. Continue tramadol if needed for pain, but if stronger medicine needed let me know. If pain not improving into Saturday morning, or worse before then, would want to check Cat scan of abdomen, so return for recheck at that time.   Start colace as stool softener to help with bowel movements.   Return to the clinic or go to the nearest emergency room if any of your symptoms worsen or new symptoms occur.

## 2014-04-06 NOTE — Patient Instructions (Signed)
Your blood count has improved. Continue sips of fluids frequently, can try soups later today. Continue tramadol if needed for pain, but if stronger medicine needed let me know. If pain not improving into Saturday morning, or worse before then, would want to check Cat scan of abdomen, so return for recheck at that time.   Start colace as stool softener to help with bowel movements.   Return to the clinic or go to the nearest emergency room if any of your symptoms worsen or new symptoms occur.

## 2014-04-06 NOTE — Progress Notes (Signed)
Xray read and patient discussed as well as examined with Dr. Maryln Gottron. Agree with assessment and plan of care per here note.

## 2014-04-07 ENCOUNTER — Telehealth: Payer: Self-pay

## 2014-04-07 LAB — URINE CULTURE: Colony Count: 55000

## 2014-04-07 NOTE — Telephone Encounter (Signed)
Can try Miralax over the counter as long as her other symptoms are still improving.

## 2014-04-07 NOTE — Telephone Encounter (Signed)
Spoke to patient. She has not had a bowel movement since Tuesday night. Started Colace yesterday and has taken 6 pills total.  States that Dr. Carlota Raspberry advised patient to [facility] for stronger medication if no BM.

## 2014-04-07 NOTE — Telephone Encounter (Signed)
Seen recently by Dr Carlota Raspberry and was told to call back if no BM by this morning. Was told something stronger than Colace would be sent in. [place], Cb# [phone]

## 2014-04-08 NOTE — Telephone Encounter (Signed)
Spoke with pt. Feeling much better overall. Let pt know to try Miralax. She will call me back tomorrow morning if still no BM

## 2014-04-22 ENCOUNTER — Other Ambulatory Visit: Payer: Self-pay | Admitting: Family Medicine

## 2014-04-24 ENCOUNTER — Other Ambulatory Visit: Payer: Self-pay | Admitting: Family Medicine

## 2014-05-26 ENCOUNTER — Ambulatory Visit: Payer: BC Managed Care – PPO | Admitting: Family Medicine

## 2014-09-01 ENCOUNTER — Other Ambulatory Visit: Payer: Self-pay | Admitting: *Deleted

## 2014-09-01 MED ORDER — LEVOTHYROXINE SODIUM 75 MCG PO TABS: ORAL_TABLET | ORAL | Status: DC

## 2014-09-01 MED ORDER — ATORVASTATIN CALCIUM 20 MG PO TABS: ORAL_TABLET | ORAL | Status: DC

## 2014-09-01 NOTE — Telephone Encounter (Signed)
Recevied fax to refill medication. Pt needs #[phone] for insurance. Called pt to schedule appt. Transferred to billing.

## 2014-10-30 ENCOUNTER — Ambulatory Visit: Payer: BC Managed Care – PPO | Admitting: Family Medicine

## 2014-12-04 ENCOUNTER — Other Ambulatory Visit: Payer: Self-pay | Admitting: Family Medicine

## 2014-12-04 ENCOUNTER — Ambulatory Visit (INDEPENDENT_AMBULATORY_CARE_PROVIDER_SITE_OTHER): Payer: BC Managed Care – PPO | Admitting: Family Medicine

## 2014-12-04 ENCOUNTER — Encounter: Payer: Self-pay | Admitting: Family Medicine

## 2014-12-04 VITALS — BP 122/82 | HR 54 | Temp 98.1°F | Resp 16 | Ht 71.5 in | Wt 160.0 lb

## 2014-12-04 DIAGNOSIS — E039 Hypothyroidism, unspecified: Secondary | ICD-10-CM

## 2014-12-04 DIAGNOSIS — G90521 Complex regional pain syndrome I of right lower limb: Secondary | ICD-10-CM

## 2014-12-04 DIAGNOSIS — E785 Hyperlipidemia, unspecified: Secondary | ICD-10-CM

## 2014-12-04 DIAGNOSIS — R140 Abdominal distension (gaseous): Secondary | ICD-10-CM

## 2014-12-04 DIAGNOSIS — M79671 Pain in right foot: Secondary | ICD-10-CM

## 2014-12-04 LAB — LIPID PANEL
Cholesterol: 135 mg/dL (ref 0–200)
HDL: 52 mg/dL (ref >39)
LDL Cholesterol: 68 mg/dL (ref 0–99)
Total CHOL/HDL Ratio: 2.6 Ratio
Triglycerides: 76 mg/dL (ref <150)
VLDL: 15 mg/dL (ref 0–40)

## 2014-12-04 LAB — COMPLETE METABOLIC PANEL WITH GFR
ALT: 12 U/L (ref 0–35)
AST: 14 U/L (ref 0–37)
Albumin: 3.8 g/dL (ref 3.5–5.2)
Alkaline Phosphatase: 70 U/L (ref 39–117)
BUN: 13 mg/dL (ref 6–23)
CO2: 28 mEq/L (ref 19–32)
Calcium: 9 mg/dL (ref 8.4–10.5)
Chloride: 106 mEq/L (ref 96–112)
Creat: 0.79 mg/dL (ref 0.50–1.10)
GFR, Est African American: >89 mL/min
GFR, Est Non African American: 83 mL/min
Glucose, Bld: 84 mg/dL (ref 70–99)
Potassium: 3.8 mEq/L (ref 3.5–5.3)
Sodium: 142 mEq/L (ref 135–145)
Total Bilirubin: 0.7 mg/dL (ref 0.2–1.2)
Total Protein: 6.3 g/dL (ref 6.0–8.3)

## 2014-12-04 MED ORDER — ATORVASTATIN CALCIUM 20 MG PO TABS: ORAL_TABLET | ORAL | Status: DC

## 2014-12-04 MED ORDER — LEVOTHYROXINE SODIUM 75 MCG PO TABS
ORAL_TABLET | ORAL | Status: DC
Start: 2014-12-04 — End: 2015-06-25

## 2014-12-04 MED ORDER — MELOXICAM 7.5 MG PO TABS: 7.5000 mg | ORAL_TABLET | Freq: Every day | ORAL | Status: DC

## 2014-12-04 MED ORDER — TRAMADOL HCL 50 MG PO TABS: 50.0000 mg | ORAL_TABLET | Freq: Three times a day (TID) | ORAL | Status: DC | PRN

## 2014-12-04 MED ORDER — GABAPENTIN 300 MG PO CAPS: 600.0000 mg | ORAL_CAPSULE | Freq: Four times a day (QID) | ORAL | Status: DC

## 2014-12-04 NOTE — Progress Notes (Signed)
Subjective:  This chart was scribed for Sylvia Ray, MD by Erling Conte, Medical Scribe. This patient was seen in Room 21 and the patient's care was started at 4:00 PM.   Patient ID: Sylvia Pruitt, female    DOB: 08-07-1956, 58 y.o.   MRN: [id number]  Chief Complaint  Patient presents with  . Follow-up    Hypothyroid, fasting labs this am  . Medication Refill    Meloxicam 15 mg, Tramadol 50 mg, Gabapentin 300 mg    HPI HPI Comments: Sylvia Pruitt is a 58 y.o. female who presents to the [facility] for a medication refill. Last visit with Dr. Carlota Raspberry was in April 2015 for acute illness at that time.   1. Hypothyroidism  Last TSH was in April 2014 that was low a 0.225, planned on rechecking in 3-4 months. Takes Synthroid 75 mcg 1x daily. Pt states she has been gaining weight and is unsure why, she notes it has been about 5 pounds. She feels an increase in the waist size and feels like she is carrying more weight in her hips area. Pt notices that she has had a lot more flatulence than normal and is unsure if it s weight or diet related. She denies missing any Synthroid doses. She denies any side effects that she is aware of. Denies any vaginal bleeding or abdominal distention.   2. Medication Refill Requests refill of Meloxicam, Tramadol and Gabapentin. She has a h/o RSD she uses Gabapentin, 300 mg 4x per day, for this problem. Denies any dizziness, lightheadedness or new side effects from the gabapentin.  Has also used Mobic for right shoulder pain and capsulitis in right foot, treated by Dr. Linna Darner in April 2014. Pt is followed by a physical therapist for this issue and also followed by Dr. Belia Heman. She would like a new prescriptions for this as needed. Pt states she would like to get a new prescription for Tramadol. She has been taking it as needed for her right foot pain. This foot pain has been an issue for 3 years. Pt states there are some months she does not  even need to take the Tramadol. The pain is exacerbated by overuse or increased activity. 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 Hyperlipidemia Last lipid panel was in April 2014 and it was controlled with LDL of 62 while on Lipitor. Pt would like a refill of her Lipitor prescription. She denies any side effects or muscle aches from the medications.   Patient Active Problem List   Diagnosis Date Noted  . 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 Hypothyroid 03/29/2013  . 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 Other and unspecified hyperlipidemia 03/29/2013   Past Medical History  Diagnosis Date  . Hypothyroid   . Foot pain, right   . Elevated lipids    No past surgical history on file. No Known Allergies Prior to Admission medications   Medication Sig Start Date End Date Taking? Authorizing Provider  atorvastatin (LIPITOR) 20 MG tablet TAKE 1 TABLET BY MOUTH DAILY 09/01/14  Yes Mancel Bale, PA-C  gabapentin (NEURONTIN) 300 MG capsule Take 2 capsules (600 mg total) by mouth 4 (four) times daily. 07/18/13  Yes Roselee Culver, MD  levothyroxine (SYNTHROID, LEVOTHROID) 75 MCG tablet TAKE 1 TABLET BY MOUTH DAILY 09/01/14  Yes Mancel Bale, PA-C  conjugated estrogens (PREMARIN) vaginal cream Apply one half applicator at bedtime for one week then  decrease to one half applicator every other night Patient not taking: Reported on 12/04/2014 11/13/13   Darlyne Russian, MD  fluconazole (DIFLUCAN) 100 MG tablet Take 1 tablet (100 mg total) by mouth daily. Patient not taking: Reported on 12/04/2014 11/09/13   Posey Boyer, MD  meloxicam (MOBIC) 15 MG tablet Take 1 tablet (15 mg total) by mouth daily. Patient not taking: Reported on 12/04/2014 07/18/13   Roselee Culver, MD  ondansetron (ZOFRAN) 8 MG tablet Take 1 tablet (8 mg total) by mouth every 8 (eight) hours as needed for nausea or vomiting. Patient not taking: Reported on 12/04/2014 04/05/14   Duane Boston, MD  traMADol  (ULTRAM) 50 MG tablet Take 1 tablet (50 mg total) by mouth every 8 (eight) hours as needed. Patient not taking: Reported on 12/04/2014 04/05/14   Duane Boston, MD   History   Social History  . Marital Status: Single    Spouse Name: N/A    Number of Children: N/A  . Years of Education: N/A   Occupational History  . Not on file.   Social History Main Topics  . Smoking status: Never Smoker   . Smokeless tobacco: Never Used  . Alcohol Use: No  . Drug Use: No  . Sexual Activity: Not on file   Other Topics Concern  . Not on file   Social History Narrative    Review of Systems  Gastrointestinal: Negative for abdominal distention.  Genitourinary: Negative for vaginal bleeding.  Musculoskeletal: Positive for arthralgias (right shoulder and right foot; chronic). Negative for myalgias.  Neurological: Negative for dizziness and light-headedness.  All other systems reviewed and are negative.      Objective:   Physical Exam  Constitutional: She is oriented to person, place, and time. She appears well-developed and well-nourished. No distress.  HENT:  Head: Normocephalic and atraumatic.  Eyes: Conjunctivae and EOM are normal. Pupils are equal, round, and reactive to light.  Neck: Neck supple. Carotid bruit is not present. No tracheal deviation present.  Cardiovascular: Normal rate, regular rhythm, normal heart sounds and intact distal pulses.   Pulmonary/Chest: Effort normal and breath sounds normal. No respiratory distress.  Abdominal: Soft. She exhibits no pulsatile midline mass. There is no tenderness.  Musculoskeletal: Normal range of motion.       Right foot: Normal. There is no tenderness.  Right foot: no pain on foot today. Skin intact. No redness  Neurological: She is alert and oriented to person, place, and time.  Skin: Skin is warm and dry.  Psychiatric: She has a normal mood and affect. Her behavior is normal.  Nursing note and vitals reviewed.    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 female Hyperlipidemia - Plan: atorvastatin (LIPITOR) 20 MG tablet  -check lipids. Refilled Lipitor at  Same dose.   Hypothyroidism, unspecified hypothyroidism type - Plan: Lipid panel, COMPLETE METABOLIC PANEL WITH GFR, TSH, levothyroxine (SYNTHROID, LEVOTHROID) 75 MCG tablet  -borderline when last checked in 2014. Refilled same dose for now, but TSH pending.   RSD lower limb, right - Plan: gabapentin (NEURONTIN) 300 MG capsule  -stable - cont Neurontin at same dose. SED.    Right foot pain -  Plan: meloxicam (MOBIC) 7.5 MG tablet  -hx of Morton's neuroma removal. Now with capsulitis, followed by PT. agreeed on few ultram if needed for breakthrough pain. But if persists, may need to see podiatrist.   Bloating symptom  -adjust diet initially and avoidance of foods that cause increased gas/bloating. If persists, consider GI eval or ultrasound of abdomen, but denies pain. Recommended GI eval as overdue for colonoscopy, but this was declined.   Meds ordered this encounter  Medications  . atorvastatin (LIPITOR) 20 MG tablet    Sig: TAKE 1 TABLET BY MOUTH DAILY    Dispense:  90 tablet    Refill:  1  . gabapentin (NEURONTIN) 300 MG capsule    Sig: Take 2 capsules (600 mg total) by mouth 4 (four) times daily.    Dispense:  720 capsule    Refill:  3  . meloxicam (MOBIC) 7.5 MG tablet    Sig: Take 1 tablet (7.5 mg total) by mouth daily.    Dispense:  30 tablet    Refill:  1  . traMADol (ULTRAM) 50 MG tablet    Sig: Take 1 tablet (50 mg total) by mouth every 8 (eight) hours as needed.    Dispense:  30 tablet    Refill:  0  . levothyroxine (SYNTHROID, LEVOTHROID) 75 MCG tablet    Sig: TAKE 1 TABLET BY MOUTH DAILY    Dispense:  90 tablet    Refill:  1   Patient  Instructions  You should receive a call or letter about your lab results within the next week to 10 days.  Call me if you discuss a foot specialist with Jenny Reichmann O'Hallor[facility], or having increased frequency of symptoms. I do recommend meeting with a gastroenterologist to discuss colonoscopy and bloating/gas feeling. Let me know if I can make that referral.  Return to the clinic or go to the nearest emergency room if any of your symptoms worsen or new symptoms occur.  PLan on physical in next 6 months.      I personally performed the services described in this documentation, which was scribed in my presence. The recorded information has been reviewed and considered, and addended by me as needed.

## 2014-12-04 NOTE — Patient Instructions (Addendum)
You should receive a call or letter about your lab results within the next week to 10 days.  Call me if you discuss a foot specialist with Jenny Reichmann O'Hallorhan, or having increased frequency of symptoms. I do recommend meeting with a gastroenterologist to discuss colonoscopy and bloating/gas feeling. Let me know if I can make that referral.  Return to the clinic or go to the nearest emergency room if any of your symptoms worsen or new symptoms occur.  PLan on physical in next 6 months.

## 2014-12-05 LAB — TSH: TSH: 0.581 u[IU]/mL (ref 0.350–4.500)

## 2014-12-19 ENCOUNTER — Encounter: Payer: Self-pay | Admitting: *Deleted

## 2015-06-25 ENCOUNTER — Other Ambulatory Visit: Payer: Self-pay | Admitting: Family Medicine

## 2015-06-27 ENCOUNTER — Other Ambulatory Visit: Payer: Self-pay | Admitting: Urgent Care

## 2015-07-24 ENCOUNTER — Other Ambulatory Visit: Payer: Self-pay | Admitting: Urgent Care

## 2015-08-29 ENCOUNTER — Other Ambulatory Visit: Payer: Self-pay | Admitting: Physician Assistant

## 2015-09-10 ENCOUNTER — Encounter: Payer: Self-pay | Admitting: Family Medicine

## 2015-09-10 ENCOUNTER — Ambulatory Visit (INDEPENDENT_AMBULATORY_CARE_PROVIDER_SITE_OTHER): Payer: BC Managed Care – PPO | Admitting: Family Medicine

## 2015-09-10 VITALS — BP 122/74 | HR 62 | Temp 98.4°F | Resp 16 | Wt 156.8 lb

## 2015-09-10 DIAGNOSIS — M79671 Pain in right foot: Secondary | ICD-10-CM

## 2015-09-10 DIAGNOSIS — E039 Hypothyroidism, unspecified: Secondary | ICD-10-CM | POA: Diagnosis not present

## 2015-09-10 DIAGNOSIS — G90521 Complex regional pain syndrome I of right lower limb: Secondary | ICD-10-CM | POA: Diagnosis not present

## 2015-09-10 DIAGNOSIS — M545 Low back pain: Secondary | ICD-10-CM

## 2015-09-10 DIAGNOSIS — E785 Hyperlipidemia, unspecified: Secondary | ICD-10-CM | POA: Diagnosis not present

## 2015-09-10 DIAGNOSIS — Z1239 Encounter for other screening for malignant neoplasm of breast: Secondary | ICD-10-CM

## 2015-09-10 DIAGNOSIS — Z23 Encounter for immunization: Secondary | ICD-10-CM

## 2015-09-10 LAB — LIPID PANEL
Cholesterol: 210 mg/dL — ABNORMAL HIGH (ref 125–200)
HDL: 61 mg/dL (ref >=46)
LDL Cholesterol: 138 mg/dL — ABNORMAL HIGH (ref <130)
Total CHOL/HDL Ratio: 3.4 Ratio (ref <=5.0)
Triglycerides: 56 mg/dL (ref <150)
VLDL: 11 mg/dL (ref <30)

## 2015-09-10 LAB — TSH: TSH: 0.662 u[IU]/mL (ref 0.350–4.500)

## 2015-09-10 MED ORDER — CYCLOBENZAPRINE HCL 10 MG PO TABS: 10.0000 mg | ORAL_TABLET | Freq: Every evening | ORAL | Status: DC | PRN

## 2015-09-10 MED ORDER — GABAPENTIN 300 MG PO CAPS: 600.0000 mg | ORAL_CAPSULE | Freq: Four times a day (QID) | ORAL | Status: DC

## 2015-09-10 MED ORDER — LEVOTHYROXINE SODIUM 75 MCG PO TABS: 75.0000 ug | ORAL_TABLET | Freq: Every day | ORAL | Status: DC

## 2015-09-10 NOTE — Progress Notes (Signed)
Subjective:    Patient ID: Sylvia Pruitt, female    DOB: January 11, 1956, 59 y.o.   MRN: [id number]  HPI This is a 59 yo who presents today for follow up of hypothyroidism, chronic foot pain/ RSD and hyperlipidemia.   Hypothyroidism- she takes levothyroxine daily before breakfast. No missed doses. Last TSH 12/04/14 was 0.581.  Hyperlipidemia- The patient stopped her lipitor about 9 months ago because she was concerned about  potential side effects, specifically liver injury. She denies any side effects and did not notice a difference in how she felt once she stopped it.   Chronic foot pain/RSD- She continues to take gabapentin for chronic foot pain and gets periodic PT by Barbaraann Barthel. She feel that the gabapentin keeps her foot pain "bearable." She exercises frequently, swimming and riding a recumbent bike. She feels much better when she exercises. She has noticed that the foot pain affects her back, causing intermittent pain. She has used a muscle relaxer in the past with good relief.   Past Medical History  Diagnosis Date  . Hypothyroid   . Foot pain, right   . Elevated lipids   No past surgical history on file. Family History  Problem Relation Age of Onset  . Heart disease Father    Social History  Substance Use Topics  . Smoking status: Never Smoker   . Smokeless tobacco: Never Used  . Alcohol Use: No   Review of Systems  Constitutional: Negative for fever, chills and fatigue.  Respiratory: Negative for cough and shortness of breath.   Cardiovascular: Negative for chest pain.  Musculoskeletal: Positive for back pain and arthralgias.  Neurological: Negative for weakness and numbness.  Psychiatric/Behavioral: Positive for sleep disturbance (with back pain).      Objective:   Physical Exam Physical Exam  Constitutional: Oriented to person, place, and time. She appears well-developed and well-nourished.  HENT:  Head: Normocephalic and atraumatic.  Eyes: Conjunctivae are  normal.  Neck: Normal range of motion. Neck supple.  Cardiovascular: Normal rate, regular rhythm and normal heart sounds.   Pulmonary/Chest: Effort normal and breath sounds normal.  Musculoskeletal: Normal range of motion.  Neurological: Alert and oriented to person, place, and time.  Skin: Skin is warm and dry.  Psychiatric: Normal mood and affect. Behavior is normal. Judgment and thought content normal.  Vitals reviewed. BP 122/74 mmHg  Pulse 62  Temp(Src) 98.4 F (36.9 C) (Oral)  Resp 16  Wt 156 lb 12.8 oz (71.124 kg)  Wt Readings from Last 3 Encounters:  09/10/15 156 lb 12.8 oz (71.124 kg)  12/04/14 160 lb (72.576 kg)  04/06/14 160 lb (72.576 kg)      Assessment & Plan:  1. Need for prophylactic vaccination and inoculation against influenza - Flu Vaccine QUAD 36+ mos IM  2. Hypothyroidism, unspecified hypothyroidism type - levothyroxine (SYNTHROID, LEVOTHROID) 75 MCG tablet; Take 1 tablet (75 mcg total) by mouth daily.  Dispense: 90 tablet; Refill: 3 - TSH  3. Right foot pain - gabapentin (NEURONTIN) 300 MG capsule; Take 2 capsules (600 mg total) by mouth 4 (four) times daily.  Dispense: 720 capsule; Refill: 3  4. RSD lower limb, right - gabapentin (NEURONTIN) 300 MG capsule; Take 2 capsules (600 mg total) by mouth 4 (four) times daily.  Dispense: 720 capsule; Refill: 3  5. Low back pain without sciatica, unspecified back pain laterality - cyclobenzaprine (FLEXERIL) 10 MG tablet; Take 1 tablet (10 mg total) by mouth at bedtime as needed for muscle spasms.  Dispense: 30 tablet; Refill: 1  6. Hyperlipidemia - Lipid panel - discussed side effects of statin medications and patient's concerns, will determine treatment based on labs  7. Screening for breast cancer - MM Digital Screening; Future   Clarene Reamer, FNP-BC  [facility]  09/10/2015 12:08 PM

## 2015-09-11 ENCOUNTER — Other Ambulatory Visit: Payer: Self-pay | Admitting: Family Medicine

## 2015-09-11 DIAGNOSIS — E785 Hyperlipidemia, unspecified: Secondary | ICD-10-CM

## 2015-09-11 MED ORDER — ATORVASTATIN CALCIUM 10 MG PO TABS: 10.0000 mg | ORAL_TABLET | Freq: Every day | ORAL | Status: DC

## 2015-09-13 ENCOUNTER — Other Ambulatory Visit: Payer: Self-pay

## 2015-09-13 DIAGNOSIS — Z1231 Encounter for screening mammogram for malignant neoplasm of breast: Secondary | ICD-10-CM

## 2015-12-24 ENCOUNTER — Ambulatory Visit: Payer: BC Managed Care – PPO

## 2016-02-04 ENCOUNTER — Ambulatory Visit
Admission: RE | Admit: 2016-02-04 | Discharge: 2016-02-04 | Disposition: A | Discharge status: Home / Self Care | Payer: BC Managed Care – PPO | Source: Ambulatory Visit | Attending: Family Medicine | Admitting: Family Medicine

## 2016-02-04 DIAGNOSIS — Z1231 Encounter for screening mammogram for malignant neoplasm of breast: Secondary | ICD-10-CM

## 2016-02-27 ENCOUNTER — Ambulatory Visit (INDEPENDENT_AMBULATORY_CARE_PROVIDER_SITE_OTHER): Payer: BC Managed Care – PPO | Admitting: Family Medicine

## 2016-02-27 ENCOUNTER — Encounter: Payer: Self-pay | Admitting: Family Medicine

## 2016-02-27 VITALS — BP 120/80 | HR 55 | Temp 98.3°F | Resp 16 | Ht 71.0 in | Wt 162.0 lb

## 2016-02-27 DIAGNOSIS — M7712 Lateral epicondylitis, left elbow: Secondary | ICD-10-CM | POA: Diagnosis not present

## 2016-02-27 NOTE — Progress Notes (Addendum)
Subjective:  By signing my name below, I, Sylvia Pruitt, attest that this documentation has been prepared under the direction and in the presence of Sylvia Ray, MD.  Electronically Signed: Thea Pruitt, ED Scribe. 02/27/2016. 4:22 PM.  Patient ID: Sylvia Pruitt, female    DOB: Oct 16, 1956, 60 y.o.   MRN: [id number]  HPI   Chief Complaint  Patient presents with  . per pt "tennis elbow"    left, started mid Jan. 2017   HPI Comments: Sylvia Pruitt is a 60 y.o. female who presents to the [facility] complaining of improving, left elbow pain that began 1.5 months ago. Pt states 1 day prior to onset of pain she was doing butterfly exercise while laying on her back and kettle ball exercise. She states pain was initially radiating from elbow down to wrist but this has somewhat improved. She reports shooting pain with certain activity and weakened left grip strength when gripping palm downward. She has worsening pain when putting on her coat. She has been taking leftover meloxicam from a previous visit for the past 20 days. She states meloxicam was helping with pain.  She was seen by PT Barbaraann Barthel about 2-3 weeks ago and was diagnosed with Tennis elbow. She had possible phonophoresis and iontophoresis.  Pt is right hand dominant. She denies neck pain and shoulder pain.    Patient Active Problem List   Diagnosis Date Noted  . 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 Hypothyroid 03/29/2013  . 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 Other and unspecified hyperlipidemia 03/29/2013   Past Medical History  Diagnosis Date  . Hypothyroid   . Foot pain, right   . Elevated lipids    No past surgical history on file. No Known Allergies Prior to Admission medications   Medication Sig Start Date End Date Taking? Authorizing Provider  atorvastatin (LIPITOR) 10 MG tablet Take 1 tablet (10 mg total) by mouth daily. 09/11/15  Yes Elby Beck, FNP  cyclobenzaprine (FLEXERIL) 10 MG tablet Take 1  tablet (10 mg total) by mouth at bedtime as needed for muscle spasms. 09/10/15  Yes Elby Beck, FNP  gabapentin (NEURONTIN) 300 MG capsule Take 2 capsules (600 mg total) by mouth 4 (four) times daily. 09/10/15  Yes Elby Beck, FNP  levothyroxine (SYNTHROID, LEVOTHROID) 75 MCG tablet Take 1 tablet (75 mcg total) by mouth daily. 09/10/15  Yes Elby Beck, FNP   Social History   Social History  . Marital Status: Single    Spouse Name: N/A  . Number of Children: N/A  . Years of Education: N/A   Occupational History  . Not on file.   Social History Main Topics  . Smoking status: Never Smoker   . Smokeless tobacco: Never Used  . Alcohol Use: No  . Drug Use: No  . Sexual Activity: Not on file   Other Topics Concern  . Not on file   Social History Narrative   Review of Systems  Musculoskeletal: Positive for arthralgias. Negative for back pain and neck pain.  Neurological: Positive for weakness. Negative for numbness.     Objective:   Physical Exam  Constitutional: She is oriented to person, place, and time. She appears well-developed and well-nourished. No distress.  HENT:  Head: Normocephalic and atraumatic.  Eyes: Conjunctivae and EOM are normal.  Neck: Neck supple.  Cardiovascular: Normal rate.   Pulmonary/Chest: Effort normal.  Musculoskeletal: Normal range of motion.  Cspine: Slight decrease rotation but  no pain. Slight decrease lateral flexion but no pain. cspine ROM does not cause symptoms.  Negative tinel's on brachial plexus/ neck on left.  Left elbow: full ROM. Skin intact. No erythema, no effusion. Tender over lateral epicondyle. And slightly tender over radial tunnel. Equal pronation and supination. Slight discomfort with wrist resisted flexion but equal.   Neurological: She is alert and oriented to person, place, and time.  Skin: Skin is warm and dry.  Psychiatric: She has a normal mood and affect. Her behavior is normal.  Nursing note and vitals  reviewed.  Filed Vitals:   02/27/16 1551  BP: 120/80  Pulse: 55  Temp: 98.3 F (36.8 C)  TempSrc: Oral  Resp: 16  Height: 5\' 11"  (1.803 m)  Weight: 162 lb (73.483 kg)  SpO2: 98%    Assessment & Plan:  ERSIE KUNTZ is a 60 y.o. female Epicondylitis, lateral (tennis elbow), left  -Exam and history consistent with lateral epicondylitis cysts of left arm. Some symptomatic improvement with previous physical therapy. Differential diagnosis includes radial tunnel syndrome, but at this point suspected epicondylitis/epicondylosis.  No weakness appreciated on exam.  -Counter force bracing with tennis elbow strap, correct use discussed.  -avoid offending activities including palm down grasping/carrying.  -Okay to continue PT including eccentric strengthening. Handout given with home exercise program.  -RTC precautions discussed, discussed holding off on injection at this time, as possible increased longevity of symptoms with injections. Consider hand/orthopaedic eval or possible trial of single injection if not improving in the next 6 weeks. Sooner if worse.  No orders of the defined types were placed in this encounter.   Patient Instructions  IF you received an x-Pruitt today, you will receive an invoice from [facility]. Please contact [facility] at [phone] with questions or concerns regarding your invoice.   IF you received labwork today, you will receive an invoice from [employer]. Please contact [facility] at [phone] with questions or concerns regarding your invoice.   Our billing staff will not be able to assist you with questions regarding bills from these companies.  You will be contacted with the lab results as soon as they are available. The fastest way to get your results is to activate your My Chart account. Instructions are located on the last page of this paperwork. If you have not heard from [place] regarding the results in 2 weeks,  please contact this office.  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  Counter force brace with activity, avoid palm-down lifting (cradling weight in that arm ok) and eccentric strengthening should help this. If not improving in next 4-6 weeks - can look at other options, referral to hand/ortho specialist or possible single injection.  Return to the clinic or go to the nearest emergency room if any of your symptoms worsen or new symptoms occur.  Lateral Epicondylitis With Rehab Lateral epicondylitis involves inflammation and pain around the outer portion of the elbow. The pain is caused by inflammation of the tendons in the forearm that bring back (extend) the wrist. Lateral epicondylitis is also called tennis elbow, because it is very common in tennis players. However, it may occur in any individual who extends the wrist repetitively. If lateral epicondylitis is left untreated, it may become a chronic problem. SYMPTOMS   Pain, tenderness, and inflammation on the outer (lateral) side of the elbow.  Pain or weakness with gripping activities.  Pain that increases with wrist-twisting motions (playing tennis, using a screwdriver, opening a door or a jar).  Pain with lifting objects, including a coffee cup. CAUSES  Lateral epicondylitis is caused by inflammation of the tendons that extend the wrist. Causes of injury may include:  Repetitive stress and strain on the muscles and tendons that extend the wrist.  Sudden change in activity level or intensity.  Incorrect grip in racquet sports.  Incorrect grip size of racquet (often too large).  Incorrect hitting position or technique (usually backhand, leading with the elbow).  Using a racket that is too heavy. RISK INCREASES WITH:  Sports or occupations that require repetitive and/or strenuous forearm and wrist movements (tennis, squash,  racquetball, carpentry).  Poor wrist and forearm strength and flexibility.  Failure to warm up properly before activity.  Resuming activity before healing, rehabilitation, and conditioning are complete. PREVENTION   Warm up and stretch properly before activity.  Maintain physical fitness:  Strength, flexibility, and endurance.  Cardiovascular fitness.  Wear and use properly fitted equipment.  Learn and use proper technique and have a coach correct improper technique.  Wear a tennis elbow (counterforce) brace. PROGNOSIS  The course of this condition depends on the degree of the injury. If treated properly, acute cases (symptoms lasting less than 4 weeks) are often resolved in 2 to 6 weeks. Chronic (longer lasting cases) often resolve in 3 to 6 months but may require physical therapy. RELATED COMPLICATIONS   Frequently recurring symptoms, resulting in a chronic problem. Properly treating the problem the first time decreases frequency of recurrence.  Chronic inflammation, scarring tendon degeneration, and partial tendon tear, requiring surgery.  Delayed healing or resolution of symptoms. TREATMENT  Treatment first involves the use of ice and medicine to reduce pain and inflammation. Strengthening and stretching exercises may help reduce discomfort if performed regularly. These exercises may be performed at home if the condition is an acute injury. Chronic cases may require a referral to a physical therapist for evaluation and treatment. Your caregiver may advise a corticosteroid injection to help reduce inflammation. Rarely, surgery is needed. MEDICATION  If pain medicine is needed, nonsteroidal anti-inflammatory medicines (aspirin and ibuprofen), or other minor pain relievers (acetaminophen), are often advised.  Do not take pain medicine for 7 days before surgery.  Prescription pain relievers may be given, if your caregiver thinks they are needed. Use only as directed and only as  much as you need.  Corticosteroid injections may be recommended. These injections should be reserved only for the most severe cases, because they can only be given a certain number of times. HEAT AND COLD  Cold treatment (icing) should be applied for 10 to 15 minutes every 2 to 3 hours for inflammation and pain, and immediately after activity that aggravates your symptoms. Use ice packs or an ice massage.  Heat treatment may be used before performing stretching and strengthening activities prescribed by your caregiver, physical therapist, or athletic trainer. Use a heat pack or a warm water soak. SEEK MEDICAL CARE IF: Symptoms get worse or do not improve in 2 weeks, despite treatment. EXERCISES  RANGE OF MOTION (ROM) AND STRETCHING EXERCISES - Epicondylitis, Lateral (Tennis Elbow) These exercises may help you when beginning to rehabilitate your injury. Your symptoms may go away with or without further involvement from your physician, physical therapist, or athletic trainer. While completing these exercises, remember:   Restoring tissue flexibility helps normal motion to return to the joints. This allows healthier, less painful movement and  activity.  An effective stretch should be held for at least 30 seconds.  A stretch should never be painful. You should only feel a gentle lengthening or release in the stretched tissue. RANGE OF MOTION - Wrist Flexion, Active-Assisted  Extend your right / left elbow with your fingers pointing down.*  Gently pull the back of your hand towards you, until you feel a gentle stretch on the top of your forearm.  Hold this position for __________ seconds. Repeat __________ times. Complete this exercise __________ times per day.  *If directed by your physician, physical therapist or athletic trainer, complete this stretch with your elbow bent, rather than extended. RANGE OF MOTION - Wrist Extension, Active-Assisted  Extend your right / left elbow and turn  your palm upwards.*  Gently pull your palm and fingertips back, so your wrist extends and your fingers point more toward the ground.  You should feel a gentle stretch on the inside of your forearm.  Hold this position for __________ seconds. Repeat __________ times. Complete this exercise __________ times per day. *If directed by your physician, physical therapist or athletic trainer, complete this stretch with your elbow bent, rather than extended. STRETCH - Wrist Flexion  Place the back of your right / left hand on a tabletop, leaving your elbow slightly bent. Your fingers should point away from your body.  Gently press the back of your hand down onto the table by straightening your elbow. You should feel a stretch on the top of your forearm.  Hold this position for __________ seconds. Repeat __________ times. Complete this stretch __________ times per day.  STRETCH - Wrist Extension   Place your right / left fingertips on a tabletop, leaving your elbow slightly bent. Your fingers should point backwards.  Gently press your fingers and palm down onto the table by straightening your elbow. You should feel a stretch on the inside of your forearm.  Hold this position for __________ seconds. Repeat __________ times. Complete this stretch __________ times per day.  STRENGTHENING EXERCISES - Epicondylitis, Lateral (Tennis Elbow) These exercises may help you when beginning to rehabilitate your injury. They may resolve your symptoms with or without further involvement from your physician, physical therapist, or athletic trainer. While completing these exercises, remember:   Muscles can gain both the endurance and the strength needed for everyday activities through controlled exercises.  Complete these exercises as instructed by your physician, physical therapist or athletic trainer. Increase the resistance and repetitions only as guided.  You may experience muscle soreness or fatigue, but  the pain or discomfort you are trying to eliminate should never worsen during these exercises. If this pain does get worse, stop and make sure you are following the directions exactly. If the pain is still present after adjustments, discontinue the exercise until you can discuss the trouble with your caregiver. STRENGTH - Wrist Flexors  Sit with your right / left forearm palm-up and fully supported on a table or countertop. Your elbow should be resting below the height of your shoulder. Allow your wrist to extend over the edge of the surface.  Loosely holding a __________ weight, or a piece of rubber exercise band or tubing, slowly curl your hand up toward your forearm.  Hold this position for __________ seconds. Slowly lower the wrist back to the starting position in a controlled manner. Repeat __________ times. Complete this exercise __________ times per day.  STRENGTH - Wrist Extensors  Sit with your right / left forearm palm-down and fully  supported on a table or countertop. Your elbow should be resting below the height of your shoulder. Allow your wrist to extend over the edge of the surface.  Loosely holding a __________ weight, or a piece of rubber exercise band or tubing, slowly curl your hand up toward your forearm.  Hold this position for __________ seconds. Slowly lower the wrist back to the starting position in a controlled manner. Repeat __________ times. Complete this exercise __________ times per day.  STRENGTH - Ulnar Deviators  Stand with a ____________________ weight in your right / left hand, or sit while holding a rubber exercise band or tubing, with your healthy arm supported on a table or countertop.  Move your wrist, so that your pinkie travels toward your forearm and your thumb moves away from your forearm.  Hold this position for __________ seconds and then slowly lower the wrist back to the starting position. Repeat __________ times. Complete this exercise __________  times per day STRENGTH - Radial Deviators  Stand with a ____________________ weight in your right / left hand, or sit while holding a rubber exercise band or tubing, with your injured arm supported on a table or countertop.  Raise your hand upward in front of you or pull up on the rubber tubing.  Hold this position for __________ seconds and then slowly lower the wrist back to the starting position. Repeat __________ times. Complete this exercise __________ times per day. STRENGTH - Forearm Supinators   Sit with your right / left forearm supported on a table, keeping your elbow below shoulder height. Rest your hand over the edge, palm down.  Gently grip a hammer or a soup ladle.  Without moving your elbow, slowly turn your palm and hand upward to a "thumbs-up" position.  Hold this position for __________ seconds. Slowly return to the starting position. Repeat __________ times. Complete this exercise __________ times per day.  STRENGTH - Forearm Pronators   Sit with your right / left forearm supported on a table, keeping your elbow below shoulder height. Rest your hand over the edge, palm up.  Gently grip a hammer or a soup ladle.  Without moving your elbow, slowly turn your palm and hand upward to a "thumbs-up" position.  Hold this position for __________ seconds. Slowly return to the starting position. Repeat __________ times. Complete this exercise __________ times per day.  STRENGTH - Grip  Grasp a tennis ball, a dense sponge, or a large, rolled sock in your hand.  Squeeze as hard as you can, without increasing any pain.  Hold this position for __________ seconds. Release your grip slowly. Repeat __________ times. Complete this exercise __________ times per day.  STRENGTH - Elbow Extensors, Isometric  Stand or sit upright, on a firm surface. Place your right / left arm so that your palm faces your stomach, and it is at the height of your waist.  Place your opposite hand on  the underside of your forearm. Gently push up as your right / left arm resists. Push as hard as you can with both arms, without causing any pain or movement at your right / left elbow. Hold this stationary position for __________ seconds. Gradually release the tension in both arms. Allow your muscles to relax completely before repeating.   This information is not intended to replace advice given to you by your health care provider. Make sure you discuss any questions you have with your health care provider.   Document Released: 12/01/2005 Document Revised: 12/22/2014 Document  Reviewed: 03/15/2009 [employer] Interactive Patient Education [employer].    I personally performed the services described in this documentation, which was scribed in my presence. The recorded information has been reviewed and considered, and addended by me as needed.

## 2016-02-27 NOTE — Patient Instructions (Addendum)
IF you received an x-ray today, you will receive an invoice from [facility]. Please contact [facility] at [phone] with questions or concerns regarding your invoice.   IF you received labwork today, you will receive an invoice from [employer]. Please contact [facility] at [phone] with questions or concerns regarding your invoice.   Our billing staff will not be able to assist you with questions regarding bills from these companies.  You will be contacted with the lab results as soon as they are available. The fastest way to get your results is to activate your My Chart account. Instructions are located on the last page of this paperwork. If you have not heard from [place] regarding the results in 2 weeks, please contact this office.  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  Counter force brace with activity, avoid palm-down lifting (cradling weight in that arm ok) and eccentric strengthening should help this. If not improving in next 4-6 weeks - can look at other options, referral to hand/ortho specialist or possible single injection.  Return to the clinic or go to the nearest emergency room if any of your symptoms worsen or new symptoms occur.  Lateral Epicondylitis With Rehab Lateral epicondylitis involves inflammation and pain around the outer portion of the elbow. The pain is caused by inflammation of the tendons in the forearm that bring back (extend) the wrist. Lateral epicondylitis is also called tennis elbow, because it is very common in tennis players. However, it may occur in any individual who extends the wrist repetitively. If lateral epicondylitis is left untreated, it may become a chronic problem. SYMPTOMS   Pain, tenderness, and inflammation on the outer (lateral) side of the elbow.  Pain or weakness with gripping activities.  Pain  that increases with wrist-twisting motions (playing tennis, using a screwdriver, opening a door or a jar).  Pain with lifting objects, including a coffee cup. CAUSES  Lateral epicondylitis is caused by inflammation of the tendons that extend the wrist. Causes of injury may include:  Repetitive stress and strain on the muscles and tendons that extend the wrist.  Sudden change in activity level or intensity.  Incorrect grip in racquet sports.  Incorrect grip size of racquet (often too large).  Incorrect hitting position or technique (usually backhand, leading with the elbow).  Using a racket that is too heavy. RISK INCREASES WITH:  Sports or occupations that require repetitive and/or strenuous forearm and wrist movements (tennis, squash, racquetball, carpentry).  Poor wrist and forearm strength and flexibility.  Failure to warm up properly before activity.  Resuming activity before healing, rehabilitation, and conditioning are complete. PREVENTION   Warm up and stretch properly before activity.  Maintain physical fitness:  Strength, flexibility, and endurance.  Cardiovascular fitness.  Wear and use properly fitted equipment.  Learn and use proper technique and have a coach correct improper technique.  Wear a tennis elbow (counterforce) brace. PROGNOSIS  The course of this condition depends on the degree of the injury. If treated properly, acute cases (symptoms lasting less than 4 weeks) are often resolved in 2 to 6 weeks. Chronic (longer lasting cases) often resolve in 3 to 6 months but may require physical therapy. RELATED COMPLICATIONS   Frequently recurring symptoms, resulting in a chronic problem. Properly treating the problem the first time decreases frequency of recurrence.  Chronic inflammation, scarring tendon degeneration, and partial tendon tear, requiring surgery.  Delayed healing or resolution of symptoms. TREATMENT  Treatment first involves the use of ice  and medicine to reduce pain and inflammation. Strengthening and stretching exercises may help reduce discomfort if performed regularly. These exercises may be performed at home if the condition is an acute injury. Chronic cases may require a referral to a physical therapist for evaluation and treatment. Your caregiver may advise a corticosteroid injection to help reduce inflammation. Rarely, surgery is needed. MEDICATION  If pain medicine is needed, nonsteroidal anti-inflammatory medicines (aspirin and ibuprofen), or other minor pain relievers (acetaminophen), are often advised.  Do not take pain medicine for 7 days before surgery.  Prescription pain relievers may be given, if your caregiver thinks they are needed. Use only as directed and only as much as you need.  Corticosteroid injections may be recommended. These injections should be reserved only for the most severe cases, because they can only be given a certain number of times. HEAT AND COLD  Cold treatment (icing) should be applied for 10 to 15 minutes every 2 to 3 hours for inflammation and pain, and immediately after activity that aggravates your symptoms. Use ice packs or an ice massage.  Heat treatment may be used before performing stretching and strengthening activities prescribed by your caregiver, physical therapist, or athletic trainer. Use a heat pack or a warm water soak. SEEK MEDICAL CARE IF: Symptoms get worse or do not improve in 2 weeks, despite treatment. EXERCISES  RANGE OF MOTION (ROM) AND STRETCHING EXERCISES - Epicondylitis, Lateral (Tennis Elbow) These exercises may help you when beginning to rehabilitate your injury. Your symptoms may go away with or without further involvement from your physician, physical therapist, or athletic trainer. While completing these exercises, remember:   Restoring tissue flexibility helps normal motion to return to the joints. This allows healthier, less painful movement and  activity.  An effective stretch should be held for at least 30 seconds.  A stretch should never be painful. You should only feel a gentle lengthening or release in the stretched tissue. RANGE OF MOTION - Wrist Flexion, Active-Assisted  Extend your right / left elbow with your fingers pointing down.*  Gently pull the back of your hand towards you, until you feel a gentle stretch on the top of your forearm.  Hold this position for __________ seconds. Repeat __________ times. Complete this exercise __________ times per day.  *If directed by your physician, physical therapist or athletic trainer, complete this stretch with your elbow bent, rather than extended. RANGE OF MOTION - Wrist Extension, Active-Assisted  Extend your right / left elbow and turn your palm upwards.*  Gently pull your palm and fingertips back, so your wrist extends and your fingers point more toward the ground.  You should feel a gentle stretch on the inside of your forearm.  Hold this position for __________ seconds. Repeat __________ times. Complete this exercise __________ times per day. *If directed by your physician, physical therapist or athletic trainer, complete this stretch with your elbow bent, rather than extended. STRETCH - Wrist Flexion  Place the back of your right / left hand on a tabletop, leaving your elbow slightly bent. Your fingers should point away from your body.  Gently press the back of your hand down onto the table by straightening your elbow. You should feel a stretch on the top of your forearm.  Hold this position for __________ seconds. Repeat __________ times. Complete this stretch __________ times per day.  STRETCH - Wrist Extension   Place  your right / left fingertips on a tabletop, leaving your elbow slightly bent. Your fingers should point backwards.  Gently press your fingers and palm down onto the table by straightening your elbow. You should feel a stretch on the inside of your  forearm.  Hold this position for __________ seconds. Repeat __________ times. Complete this stretch __________ times per day.  STRENGTHENING EXERCISES - Epicondylitis, Lateral (Tennis Elbow) These exercises may help you when beginning to rehabilitate your injury. They may resolve your symptoms with or without further involvement from your physician, physical therapist, or athletic trainer. While completing these exercises, remember:   Muscles can gain both the endurance and the strength needed for everyday activities through controlled exercises.  Complete these exercises as instructed by your physician, physical therapist or athletic trainer. Increase the resistance and repetitions only as guided.  You may experience muscle soreness or fatigue, but the pain or discomfort you are trying to eliminate should never worsen during these exercises. If this pain does get worse, stop and make sure you are following the directions exactly. If the pain is still present after adjustments, discontinue the exercise until you can discuss the trouble with your caregiver. STRENGTH - Wrist Flexors  Sit with your right / left forearm palm-up and fully supported on a table or countertop. Your elbow should be resting below the height of your shoulder. Allow your wrist to extend over the edge of the surface.  Loosely holding a __________ weight, or a piece of rubber exercise band or tubing, slowly curl your hand up toward your forearm.  Hold this position for __________ seconds. Slowly lower the wrist back to the starting position in a controlled manner. Repeat __________ times. Complete this exercise __________ times per day.  STRENGTH - Wrist Extensors  Sit with your right / left forearm palm-down and fully supported on a table or countertop. Your elbow should be resting below the height of your shoulder. Allow your wrist to extend over the edge of the surface.  Loosely holding a __________ weight, or a piece  of rubber exercise band or tubing, slowly curl your hand up toward your forearm.  Hold this position for __________ seconds. Slowly lower the wrist back to the starting position in a controlled manner. Repeat __________ times. Complete this exercise __________ times per day.  STRENGTH - Ulnar Deviators  Stand with a ____________________ weight in your right / left hand, or sit while holding a rubber exercise band or tubing, with your healthy arm supported on a table or countertop.  Move your wrist, so that your pinkie travels toward your forearm and your thumb moves away from your forearm.  Hold this position for __________ seconds and then slowly lower the wrist back to the starting position. Repeat __________ times. Complete this exercise __________ times per day STRENGTH - Radial Deviators  Stand with a ____________________ weight in your right / left hand, or sit while holding a rubber exercise band or tubing, with your injured arm supported on a table or countertop.  Raise your hand upward in front of you or pull up on the rubber tubing.  Hold this position for __________ seconds and then slowly lower the wrist back to the starting position. Repeat __________ times. Complete this exercise __________ times per day. STRENGTH - Forearm Supinators   Sit with your right / left forearm supported on a table, keeping your elbow below shoulder height. Rest your hand over the edge, palm down.  Gently grip a hammer or  a soup ladle.  Without moving your elbow, slowly turn your palm and hand upward to a "thumbs-up" position.  Hold this position for __________ seconds. Slowly return to the starting position. Repeat __________ times. Complete this exercise __________ times per day.  STRENGTH - Forearm Pronators   Sit with your right / left forearm supported on a table, keeping your elbow below shoulder height. Rest your hand over the edge, palm up.  Gently grip a hammer or a soup  ladle.  Without moving your elbow, slowly turn your palm and hand upward to a "thumbs-up" position.  Hold this position for __________ seconds. Slowly return to the starting position. Repeat __________ times. Complete this exercise __________ times per day.  STRENGTH - Grip  Grasp a tennis ball, a dense sponge, or a large, rolled sock in your hand.  Squeeze as hard as you can, without increasing any pain.  Hold this position for __________ seconds. Release your grip slowly. Repeat __________ times. Complete this exercise __________ times per day.  STRENGTH - Elbow Extensors, Isometric  Stand or sit upright, on a firm surface. Place your right / left arm so that your palm faces your stomach, and it is at the height of your waist.  Place your opposite hand on the underside of your forearm. Gently push up as your right / left arm resists. Push as hard as you can with both arms, without causing any pain or movement at your right / left elbow. Hold this stationary position for __________ seconds. Gradually release the tension in both arms. Allow your muscles to relax completely before repeating.   This information is not intended to replace advice given to you by your health care provider. Make sure you discuss any questions you have with your health care provider.   Document Released: 12/01/2005 Document Revised: 12/22/2014 Document Reviewed: 03/15/2009 Elsevier Interactive Patient Education [employer].

## 2016-03-26 ENCOUNTER — Ambulatory Visit: Payer: BC Managed Care – PPO | Admitting: Family Medicine

## 2016-08-19 ENCOUNTER — Ambulatory Visit (INDEPENDENT_AMBULATORY_CARE_PROVIDER_SITE_OTHER): Payer: BC Managed Care – PPO | Admitting: Family Medicine

## 2016-08-19 VITALS — BP 122/72 | HR 65 | Temp 98.6°F | Resp 17 | Ht 71.0 in | Wt 161.0 lb

## 2016-08-19 DIAGNOSIS — B029 Zoster without complications: Secondary | ICD-10-CM | POA: Diagnosis not present

## 2016-08-19 DIAGNOSIS — M545 Low back pain, unspecified: Secondary | ICD-10-CM

## 2016-08-19 MED ORDER — OXYCODONE-ACETAMINOPHEN 5-325 MG PO TABS: 1.0000 | ORAL_TABLET | Freq: Three times a day (TID) | ORAL | 0 refills | Status: DC | PRN

## 2016-08-19 MED ORDER — DOCUSATE SODIUM 100 MG PO CAPS: 100.0000 mg | ORAL_CAPSULE | Freq: Every day | ORAL | 0 refills | Status: DC

## 2016-08-19 NOTE — Progress Notes (Signed)
By signing my name below I, Tereasa Coop, attest that this documentation has been prepared under the direction and in the presence of Wendie Agreste, MD. Electonically Signed. Tereasa Coop, Scribe 08/19/2016 at 1:22 PM  Subjective:    Patient ID: Sylvia Pruitt, female    DOB: [birth year], 60 y.o.   MRN: [id number]  Chief Complaint  Patient presents with  . Herpes Zoster    HPI Sylvia Pruitt is a 60 y.o. female who presents to the [facility] complaining of painful rash that was first noticed on her abd 3 days ago. Pt also reports having low back pain.  Pt initially seen and evaluated at [facility] and was started on valtrex, ibuprofen, and hydrocodone. Pt states that ibuprofen and hydrocodone provide minimal relief. Pt reports trying her daughter's oxycodone with moderate relief.   Patient Active Problem List   Diagnosis Date Noted  . 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 Hypothyroid 03/29/2013  . 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 Other and unspecified hyperlipidemia 03/29/2013   Past Medical History:  Diagnosis Date  . Elevated lipids   . Foot pain, right   . Hypothyroid    No past surgical history on file. No Known Allergies Prior to Admission medications   Medication Sig Start Date End Date Taking? Authorizing Provider  atorvastatin (LIPITOR) 10 MG tablet Take 1 tablet (10 mg total) by mouth daily. 09/11/15  Yes Elby Beck, FNP  cyclobenzaprine (FLEXERIL) 10 MG tablet Take 1 tablet (10 mg total) by mouth at bedtime as needed for muscle spasms. 09/10/15  Yes Elby Beck, FNP  gabapentin (NEURONTIN) 300 MG capsule Take 2 capsules (600 mg total) by mouth 4 (four) times daily. 09/10/15  Yes Elby Beck, FNP  levothyroxine (SYNTHROID, LEVOTHROID) 75 MCG tablet Take 1 tablet (75 mcg total) by mouth daily. 09/10/15  Yes Elby Beck, FNP   Social History   Social History  . Marital status: Single    Spouse name: N/A  . Number of children:  N/A  . Years of education: N/A   Occupational History  . Not on file.   Social History Main Topics  . Smoking status: Never Smoker  . Smokeless tobacco: Never Used  . Alcohol use No  . Drug use: No  . Sexual activity: Not on file   Other Topics Concern  . Not on file   Social History Narrative  . No narrative on file      Review of Systems  Musculoskeletal: Positive for back pain.  Skin: Positive for rash (painful rash on abd).       Objective:   Physical Exam  Constitutional: She is oriented to person, place, and time. She appears well-developed and well-nourished. No distress.  HENT:  Head: Normocephalic and atraumatic.  Eyes: Conjunctivae are normal. Pupils are equal, round, and reactive to light.  Neck: Neck supple.  Cardiovascular: Normal rate.   Pulmonary/Chest: Effort normal.  Musculoskeletal: Normal range of motion.  Neurological: She is alert and oriented to person, place, and time.  Skin: Skin is warm and dry. Rash noted. Rash is vesicular.  Pt has a clustered vesicular rash rt of umbilicus and another patch on rt lower back. Both patches of rash are along the 9th or 10th dermatone.  Psychiatric: She has a normal mood and affect. Her behavior is normal.  Nursing note and vitals reviewed.    Vitals:   08/19/16 1153  BP: 122/72  Pulse:  65  Resp: 17  Temp: 98.6 F (37 C)  TempSrc: Oral  SpO2: 98%  Weight: 161 lb (73 kg)  Height: 5\' 11"  (1.803 m)         Assessment & Plan:    [name] is a 60 y.o. female Zoster - Plan: oxyCODONE-acetaminophen (ROXICET) 5-325 MG tablet  Right-sided low back pain without sciatica - Plan: oxyCODONE-acetaminophen (ROXICET) 5-325 MG tablet  Right sided T9 versus T10 distribution of herpes zoster.  Appropriately was started on Valtrex 2 days ago, but pain not improved with hydrocodone.  -Prescription for Percocet was given as she had relief with that at home.  - Colace discussed and prescribed for  constipation prevention, symptomatic care and RTC precautions were discussed.  - Currently on gabapentin. Dose was not changed.  -Plan on shingles vaccination in approximately 6 months.  Meds ordered this encounter  Medications  . oxyCODONE-acetaminophen (ROXICET) 5-325 MG tablet    Sig: Take 1 tablet by mouth every 8 (eight) hours as needed for severe pain.    Dispense:  20 tablet    Refill:  0  . docusate sodium (COLACE) 100 MG capsule    Sig: Take 1 capsule (100 mg total) by mouth daily.    Dispense:  10 capsule    Refill:  0   Patient Instructions    Unfortunately it does appear that you have shingles. Continue the Valtrex 1 pill 3 times per day for the entire week. Ibuprofen up to every 6 hours, and Percocet if needed up to every 6 hours. Stool softener once per day, drink plenty of fluid and fiber in the diet. If your symptoms are not improving within the next 1-2 weeks, or any worsening sooner return for recheck. If you do need to refill the Percocet, return for recheck.   We can plan on shingles vaccine in approximately 6 months.  Shingles Shingles, which is also known as herpes zoster, is an infection that causes a painful skin rash and fluid-filled blisters. Shingles is not related to genital herpes, which is a sexually transmitted infection.   Shingles only develops in people who:  Have had chickenpox.  Have received the chickenpox vaccine. (This is rare.) CAUSES Shingles is caused by varicella-zoster virus (VZV). This is the same virus that causes chickenpox. After exposure to VZV, the virus stays in the body in an inactive (dormant) state. Shingles develops if the virus reactivates. This can happen many years after the initial exposure to VZV. It is not known what causes this virus to reactivate. RISK FACTORS People who have had chickenpox or received the chickenpox vaccine are at risk for shingles. Infection is more common in people who:  Are older than age  33.  Have a weakened defense (immune) system, such as those with HIV, AIDS, or cancer.  Are taking medicines that weaken the immune system, such as transplant medicines.  Are under great stress. SYMPTOMS Early symptoms of this condition include itching, tingling, and pain in an area on your skin. Pain may be described as burning, stabbing, or throbbing. A few days or weeks after symptoms start, a painful red rash appears, usually on one side of the body in a bandlike or beltlike pattern. The rash eventually turns into fluid-filled blisters that break open, scab over, and dry up in about 2-3 weeks. At any time during the infection, you may also develop:  A fever.  Chills.  A headache.  An upset stomach. DIAGNOSIS This condition is diagnosed with  a skin exam. Sometimes, skin or fluid samples are taken from the blisters before a diagnosis is made. These samples are examined under a microscope or sent to a lab for testing. TREATMENT There is no specific cure for this condition. Your health care provider will probably prescribe medicines to help you manage pain, recover more quickly, and avoid long-term problems. Medicines may include:  Antiviral drugs.  Anti-inflammatory drugs.  Pain medicines. If the area involved is on your face, you may be referred to a specialist, such as an eye doctor (ophthalmologist) or an ear, nose, and throat (ENT) doctor to help you avoid eye problems, chronic pain, or disability. HOME CARE INSTRUCTIONS Medicines  Take medicines only as directed by your health care provider.  Apply an anti-itch or numbing cream to the affected area as directed by your health care provider. Blister and Rash Care  Take a cool bath or apply cool compresses to the area of the rash or blisters as directed by your health care provider. This may help with pain and itching.  Keep your rash covered with a loose bandage (dressing). Wear loose-fitting clothing to help ease the pain  of material rubbing against the rash.  Keep your rash and blisters clean with mild soap and cool water or as directed by your health care provider.  Check your rash every day for signs of infection. These include redness, swelling, and pain that lasts or increases.  Do not pick your blisters.  Do not scratch your rash. General Instructions  Rest as directed by your health care provider.  Keep all follow-up visits as directed by your health care provider. This is important.  Until your blisters scab over, your infection can cause chickenpox in people who have never had it or been vaccinated against it. To prevent this from happening, avoid contact with other people, especially:  Babies.  Pregnant women.  Children who have eczema.  Elderly people who have transplants.  People who have chronic illnesses, such as leukemia or AIDS. SEEK MEDICAL CARE IF:  Your pain is not relieved with prescribed medicines.  Your pain does not get better after the rash heals.  Your rash looks infected. Signs of infection include redness, swelling, and pain that lasts or increases. SEEK IMMEDIATE MEDICAL CARE IF:  The rash is on your face or nose.  You have facial pain, pain around your eye area, or loss of feeling on one side of your face.  You have ear pain or you have ringing in your ear.  You have loss of taste.  Your condition gets worse.   This information is not intended to replace advice given to you by your health care provider. Make sure you discuss any questions you have with your health care provider.   Document Released: 12/01/2005 Document Revised: 12/22/2014 Document Reviewed: 10/12/2014 Elsevier Interactive Patient Education 2016 [employer].   IF you received an x-ray today, you will receive an invoice from [facility]. Please contact [facility] at [phone] with questions or concerns regarding your invoice.   IF you received labwork today, you  will receive an invoice from [employer]. Please contact [facility] at [phone] with questions or concerns regarding your invoice.   Our billing staff will not be able to assist you with questions regarding bills from these companies.  You will be contacted with the lab results as soon as they are available. The fastest way to get your results is to activate your My Chart account. Instructions  are located on the last page of this paperwork. If you have not heard from [place] regarding the results in 2 weeks, please contact this office.        I personally performed the services described in this documentation, which was scribed in my presence. The recorded information has been reviewed and considered, and addended by me as needed.   Signed,   [name], MD [facility].  08/19/16 3:55 PM

## 2016-08-19 NOTE — Patient Instructions (Addendum)
Unfortunately it does appear that you have shingles. Continue the Valtrex 1 pill 3 times per day for the entire week. Ibuprofen up to every 6 hours, and Percocet if needed up to every 6 hours. Stool softener once per day, drink plenty of fluid and fiber in the diet. If your symptoms are not improving within the next 1-2 weeks, or any worsening sooner return for recheck. If you do need to refill the Percocet, return for recheck.   We can plan on shingles vaccine in approximately 6 months.  Shingles Shingles, which is also known as herpes zoster, is an infection that causes a painful skin rash and fluid-filled blisters. Shingles is not related to genital herpes, which is a sexually transmitted infection.   Shingles only develops in people who:  Have had chickenpox.  Have received the chickenpox vaccine. (This is rare.) CAUSES Shingles is caused by varicella-zoster virus (VZV). This is the same virus that causes chickenpox. After exposure to VZV, the virus stays in the body in an inactive (dormant) state. Shingles develops if the virus reactivates. This can happen many years after the initial exposure to VZV. It is not known what causes this virus to reactivate. RISK FACTORS People who have had chickenpox or received the chickenpox vaccine are at risk for shingles. Infection is more common in people who:  Are older than age 14.  Have a weakened defense (immune) system, such as those with HIV, AIDS, or cancer.  Are taking medicines that weaken the immune system, such as transplant medicines.  Are under great stress. SYMPTOMS Early symptoms of this condition include itching, tingling, and pain in an area on your skin. Pain may be described as burning, stabbing, or throbbing. A few days or weeks after symptoms start, a painful red rash appears, usually on one side of the body in a bandlike or beltlike pattern. The rash eventually turns into fluid-filled blisters that break open, scab over, and  dry up in about 2-3 weeks. At any time during the infection, you may also develop:  A fever.  Chills.  A headache.  An upset stomach. DIAGNOSIS This condition is diagnosed with a skin exam. Sometimes, skin or fluid samples are taken from the blisters before a diagnosis is made. These samples are examined under a microscope or sent to a lab for testing. TREATMENT There is no specific cure for this condition. Your health care provider will probably prescribe medicines to help you manage pain, recover more quickly, and avoid long-term problems. Medicines may include:  Antiviral drugs.  Anti-inflammatory drugs.  Pain medicines. If the area involved is on your face, you may be referred to a specialist, such as an eye doctor (ophthalmologist) or an ear, nose, and throat (ENT) doctor to help you avoid eye problems, chronic pain, or disability. HOME CARE INSTRUCTIONS Medicines  Take medicines only as directed by your health care provider.  Apply an anti-itch or numbing cream to the affected area as directed by your health care provider. Blister and Rash Care  Take a cool bath or apply cool compresses to the area of the rash or blisters as directed by your health care provider. This may help with pain and itching.  Keep your rash covered with a loose bandage (dressing). Wear loose-fitting clothing to help ease the pain of material rubbing against the rash.  Keep your rash and blisters clean with mild soap and cool water or as directed by your health care provider.  Check your rash every day for  signs of infection. These include redness, swelling, and pain that lasts or increases.  Do not pick your blisters.  Do not scratch your rash. General Instructions  Rest as directed by your health care provider.  Keep all follow-up visits as directed by your health care provider. This is important.  Until your blisters scab over, your infection can cause chickenpox in people who have never  had it or been vaccinated against it. To prevent this from happening, avoid contact with other people, especially:  Babies.  Pregnant women.  Children who have eczema.  Elderly people who have transplants.  People who have chronic illnesses, such as leukemia or AIDS. SEEK MEDICAL CARE IF:  Your pain is not relieved with prescribed medicines.  Your pain does not get better after the rash heals.  Your rash looks infected. Signs of infection include redness, swelling, and pain that lasts or increases. SEEK IMMEDIATE MEDICAL CARE IF:  The rash is on your face or nose.  You have facial pain, pain around your eye area, or loss of feeling on one side of your face.  You have ear pain or you have ringing in your ear.  You have loss of taste.  Your condition gets worse.   This information is not intended to replace advice given to you by your health care provider. Make sure you discuss any questions you have with your health care provider.   Document Released: 12/01/2005 Document Revised: 12/22/2014 Document Reviewed: 10/12/2014 Elsevier Interactive Patient Education 2016 [employer].   IF you received an x-ray today, you will receive an invoice from [facility]. Please contact [facility] Radiology at [phone] with questions or concerns regarding your invoice.   IF you received labwork today, you will receive an invoice from [employer]. Please contact [facility] at [phone] with questions or concerns regarding your invoice.   Our billing staff will not be able to assist you with questions regarding bills from these companies.  You will be contacted with the lab results as soon as they are available. The fastest way to get your results is to activate your My Chart account. Instructions are located on the last page of this paperwork. If you have not heard from [place] regarding the results in 2 weeks, please contact this office.

## 2016-09-19 ENCOUNTER — Ambulatory Visit: Payer: BC Managed Care – PPO

## 2016-09-25 ENCOUNTER — Telehealth: Payer: Self-pay | Admitting: Emergency Medicine

## 2016-09-25 ENCOUNTER — Ambulatory Visit: Payer: BC Managed Care – PPO | Admitting: Family Medicine

## 2016-10-07 ENCOUNTER — Other Ambulatory Visit: Payer: Self-pay | Admitting: Family Medicine

## 2016-10-07 DIAGNOSIS — E785 Hyperlipidemia, unspecified: Secondary | ICD-10-CM

## 2016-10-07 DIAGNOSIS — E039 Hypothyroidism, unspecified: Secondary | ICD-10-CM

## 2016-10-11 ENCOUNTER — Other Ambulatory Visit: Payer: Self-pay | Admitting: *Deleted

## 2016-10-11 DIAGNOSIS — E785 Hyperlipidemia, unspecified: Secondary | ICD-10-CM

## 2016-10-11 MED ORDER — LEVOTHYROXINE SODIUM 75 MCG PO TABS: 75.0000 ug | ORAL_TABLET | Freq: Every day | ORAL | 1 refills | Status: DC

## 2016-10-11 MED ORDER — ATORVASTATIN CALCIUM 10 MG PO TABS: 10.0000 mg | ORAL_TABLET | Freq: Every day | ORAL | 0 refills | Status: DC

## 2016-10-11 NOTE — Telephone Encounter (Signed)
Refilled for 30 day supply only f.  Patient is past due for OV.  Patient will schedule appt today.  Her appt was cancelled with Dr. Carlota Raspberry because he was sick.

## 2016-10-13 ENCOUNTER — Ambulatory Visit (INDEPENDENT_AMBULATORY_CARE_PROVIDER_SITE_OTHER): Payer: BC Managed Care – PPO | Admitting: Family Medicine

## 2016-10-13 VITALS — BP 118/78 | HR 64 | Temp 98.3°F | Resp 17 | Ht 71.0 in | Wt 162.0 lb

## 2016-10-13 DIAGNOSIS — M79671 Pain in right foot: Secondary | ICD-10-CM | POA: Diagnosis not present

## 2016-10-13 DIAGNOSIS — E039 Hypothyroidism, unspecified: Secondary | ICD-10-CM | POA: Diagnosis not present

## 2016-10-13 DIAGNOSIS — E785 Hyperlipidemia, unspecified: Secondary | ICD-10-CM | POA: Diagnosis not present

## 2016-10-13 DIAGNOSIS — R7309 Other abnormal glucose: Secondary | ICD-10-CM

## 2016-10-13 DIAGNOSIS — E7800 Pure hypercholesterolemia, unspecified: Secondary | ICD-10-CM

## 2016-10-13 MED ORDER — ATORVASTATIN CALCIUM 10 MG PO TABS: 10.0000 mg | ORAL_TABLET | Freq: Every day | ORAL | 3 refills | Status: DC

## 2016-10-13 MED ORDER — GABAPENTIN 300 MG PO CAPS: 600.0000 mg | ORAL_CAPSULE | Freq: Four times a day (QID) | ORAL | 3 refills | Status: DC

## 2016-10-13 NOTE — Progress Notes (Signed)
Patient ID: Sylvia Pruitt, female    DOB: 1956/02/16, 60 y.o.   MRN: [id number]  PCP: No PCP Per Patient  Chief Complaint  Patient presents with  . Medication Refill    synthroid, lipitor and gabapentin    Subjective:   HPI 60 year old female presents for medication refills.  She is a regular patient here at [facility] Chronic condition include hypothyroidism, hyperlipidemia, and chronic pain. Exercises at the gym 3-5 days per week. Denies fatigue, trouble sleeping, or unintentional weight loss or gain. 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 Denies any other complaints at present.  Social History   Social History  . Marital status: Single    Spouse name: N/A  . Number of children: N/A  . Years of education: N/A   Occupational History  . Not on file.   Social History Main Topics  . Smoking status: Never Smoker  . Smokeless tobacco: Never Used  . Alcohol use No  . Drug use: No  . Sexual activity: Not on file   Other Topics Concern  . Not on file   Social History Narrative  . No narrative on file   Family History  Problem Relation Age of Onset  . Heart disease Father     Review of Systems See HPI   Patient Active Problem List   Diagnosis Date Noted  . 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 Hypothyroid 03/29/2013  . 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 Other and unspecified hyperlipidemia 03/29/2013     Prior to Admission medications   Medication Sig Start Date End Date Taking? Authorizing Provider  atorvastatin (LIPITOR) 10 MG tablet Take 1 tablet (10 mg total) by mouth daily. 10/11/16  Yes Jaynee Eagles, PA-C  cyclobenzaprine (FLEXERIL) 10 MG tablet Take 1 tablet (10 mg total) by mouth at bedtime as needed for muscle spasms. 09/10/15  Yes Elby Beck, FNP  gabapentin (NEURONTIN) 300 MG capsule Take 2 capsules (600 mg total) by mouth 4 (four) times daily. 09/10/15  Yes Elby Beck, FNP  levothyroxine  (SYNTHROID, LEVOTHROID) 75 MCG tablet Take 1 tablet (75 mcg total) by mouth daily. 10/11/16  Yes Jaynee Eagles, PA-C  docusate sodium (COLACE) 100 MG capsule Take 1 capsule (100 mg total) by mouth daily. Patient not taking: Reported on 10/13/2016 08/19/16   Wendie Agreste, MD  oxyCODONE-acetaminophen (ROXICET) 5-325 MG tablet Take 1 tablet by mouth every 8 (eight) hours as needed for severe pain. Patient not taking: Reported on 10/13/2016 08/19/16   Wendie Agreste, MD     No Known Allergies     Objective:  Physical Exam  Constitutional: She is oriented to person, place, and time. She appears well-developed and well-nourished.  HENT:  Head: Normocephalic and atraumatic.  Right Ear: External ear normal.  Left Ear: External ear normal.  Nose: Nose normal.  Mouth/Throat: Oropharynx is clear and moist.  Eyes: Conjunctivae and EOM are normal. Pupils are equal, round, and reactive to light.  Neck: Normal range of motion. Neck supple.  Cardiovascular: Normal rate, regular rhythm, normal heart sounds and intact distal pulses.   Pulmonary/Chest: Effort normal and breath sounds normal.  Musculoskeletal: Normal range of motion.  Neurological: She is alert and oriented to person, place, and time.  Skin: Skin is warm and dry.  Psychiatric: She has a normal mood and affect. Her behavior is normal. Judgment and thought content normal.     Vitals:   10/13/16  1744  BP: 118/78  Pulse: 64  Resp: 17  Temp: 98.3 F (36.8 C)     Assessment & Plan:  1. Hyperlipidemia, unspecified hyperlipidemia type - COMPLETE METABOLIC PANEL WITH GFR - CBC with Differential/Platelet - Lipid panel - atorvastatin (LIPITOR) 10 MG tablet; Take 1 tablet (10 mg total) by mouth daily.  Dispense: 90 tablet; Refill: 3  2. Hypothyroidism, unspecified type - Thyroid Panel With TSH  3. Right foot pain - gabapentin (NEURONTIN) 300 MG capsule; Take 2 capsules (600 mg total) by mouth 4 (four) times daily.  Dispense: 720  capsule; Refill: 3  4. Elevated glucose - Hemoglobin A1C-pending   Follow-up as soon possible.  Carroll Sage. Kenton Kingfisher, MSN, FNP-C [facility]

## 2016-10-13 NOTE — Patient Instructions (Addendum)
I will follow-up with you regarding your lab results.  I have refilled your Gabapentin and Lipitor. I will wait for your thyroid function test to result before I refill your Synthroid.  Nice meeting you!  Carroll Sage. Kenton Kingfisher, MSN, FNP-C [facility]  IF you received an x-ray today, you will receive an invoice from [facility] Radiology. Please contact [facility]iology at [phone] with questions or concerns regarding your invoice.   IF you received labwork today, you will receive an invoice from [employer]. Please contact [facility] at [phone] with questions or concerns regarding your invoice.   Our billing staff will not be able to assist you with questions regarding bills from these companies.  You will be contacted with the lab results as soon as they are available. The fastest way to get your results is to activate your My Chart account. Instructions are located on the last page of this paperwork. If you have not heard from [place] regarding the results in 2 weeks, please contact this office.     Exercising to Stay Healthy Exercising regularly is important. It has many health benefits, such as:  Improving your overall fitness, flexibility, and endurance.  Increasing your bone density.  Helping with weight control.  Decreasing your body fat.  Increasing your muscle strength.  Reducing stress and tension.  Improving your overall health. In order to become healthy and stay healthy, it is recommended that you do moderate-intensity and vigorous-intensity exercise. You can tell that you are exercising at a moderate intensity if you have a higher heart rate and faster breathing, but you are still able to hold a conversation. You can tell that you are exercising at a vigorous intensity if you are breathing much harder and faster and cannot hold a conversation while exercising. HOW OFTEN SHOULD I EXERCISE? Choose  an activity that you enjoy and set realistic goals. Your health care provider can help you to make an activity plan that works for you. Exercise regularly as directed by your health care provider. This may include:   Doing resistance training twice each week, such as:  Push-ups.  Sit-ups.  Lifting weights.  Using resistance bands.  Doing a given intensity of exercise for a given amount of time. Choose from these options:  150 minutes of moderate-intensity exercise every week.  75 minutes of vigorous-intensity exercise every week.  A mix of moderate-intensity and vigorous-intensity exercise every week. Children, pregnant women, people who are out of shape, people who are overweight, and older adults may need to consult a health care provider for individual recommendations. If you have any sort of medical condition, be sure to consult your health care provider before starting a new exercise program.  WHAT ARE SOME EXERCISE IDEAS? Some moderate-intensity exercise ideas include:   Walking at a rate of 1 mile in 15 minutes.  Biking.  Hiking.  Golfing.  Dancing. Some vigorous-intensity exercise ideas include:   Walking at a rate of at least 4.5 miles per hour.  Jogging or running at a rate of 5 miles per hour.  Biking at a rate of at least 10 miles per hour.  Lap swimming.  Roller-skating or in-line skating.  Cross-country skiing.  Vigorous competitive sports, such as football, basketball, and soccer.  Jumping rope.  Aerobic dancing. WHAT ARE SOME EVERYDAY ACTIVITIES THAT CAN HELP ME TO GET EXERCISE?  Yard work, such as:  Psychologist, educational.  Raking and bagging leaves.  Washing  and waxing your car.  Pushing a stroller.  Shoveling snow.  Gardening.  Washing windows or floors. HOW CAN I BE MORE ACTIVE IN MY DAY-TO-DAY ACTIVITIES?  Use the stairs instead of the elevator.  Take a walk during your lunch break.  If you drive, park your car farther away  from work or school.  If you take public transportation, get off one stop early and walk the rest of the way.  Make all of your phone calls while standing up and walking around.  Get up, stretch, and walk around every 30 minutes throughout the day. WHAT GUIDELINES SHOULD I FOLLOW WHILE EXERCISING?  Do not exercise so much that you hurt yourself, feel dizzy, or get very short of breath.  Consult your health care provider before starting a new exercise program.  Wear comfortable clothes and shoes with good support.  Drink plenty of water while you exercise to prevent dehydration or heat stroke. Body water is lost during exercise and must be replaced.  Work out until you breathe faster and your heart beats faster.   This information is not intended to replace advice given to you by your health care provider. Make sure you discuss any questions you have with your health care provider.   Document Released: 01/03/2011 Document Revised: 12/22/2014 Document Reviewed: 05/04/2014 Elsevier Interactive Patient Education [employer].

## 2016-10-14 LAB — CBC WITH DIFFERENTIAL/PLATELET
Basophils Absolute: 55 cells/uL (ref 0–200)
Basophils Relative: 1 %
Eosinophils Absolute: 220 cells/uL (ref 15–500)
Eosinophils Relative: 4 %
HCT: 41.6 % (ref 35.0–45.0)
Hemoglobin: 14 g/dL (ref 11.7–15.5)
Lymphocytes Relative: 34 %
Lymphs Abs: 1870 cells/uL (ref 850–3900)
MCH: 31.5 pg (ref 27.0–33.0)
MCHC: 33.7 g/dL (ref 32.0–36.0)
MCV: 93.5 fL (ref 80.0–100.0)
MPV: 10.4 fL (ref 7.5–12.5)
Monocytes Absolute: 385 cells/uL (ref 200–950)
Monocytes Relative: 7 %
Neutro Abs: 2970 cells/uL (ref 1500–7800)
Neutrophils Relative %: 54 %
Platelets: 245 10*3/uL (ref 140–400)
RBC: 4.45 MIL/uL (ref 3.80–5.10)
RDW: 13 % (ref 11.0–15.0)
WBC: 5.5 10*3/uL (ref 3.8–10.8)

## 2016-10-15 LAB — COMPLETE METABOLIC PANEL WITH GFR
ALT: 10 U/L (ref 6–29)
AST: 14 U/L (ref 10–35)
Albumin: 3.9 g/dL (ref 3.6–5.1)
Alkaline Phosphatase: 71 U/L (ref 33–130)
BUN: 18 mg/dL (ref 7–25)
CO2: 26 mmol/L (ref 20–31)
Calcium: 9.1 mg/dL (ref 8.6–10.4)
Chloride: 106 mmol/L (ref 98–110)
Creat: 0.91 mg/dL (ref 0.50–1.05)
GFR, Est African American: 80 mL/min (ref >=60)
GFR, Est Non African American: 69 mL/min (ref >=60)
Glucose, Bld: 136 mg/dL — ABNORMAL HIGH (ref 65–99)
Potassium: 4 mmol/L (ref 3.5–5.3)
Sodium: 141 mmol/L (ref 135–146)
Total Bilirubin: 0.4 mg/dL (ref 0.2–1.2)
Total Protein: 6.5 g/dL (ref 6.1–8.1)

## 2016-10-15 LAB — LIPID PANEL
Cholesterol: 209 mg/dL — ABNORMAL HIGH (ref 125–200)
HDL: 52 mg/dL (ref >=46)
LDL Cholesterol: 122 mg/dL (ref <130)
Total CHOL/HDL Ratio: 4 Ratio (ref <=5.0)
Triglycerides: 174 mg/dL — ABNORMAL HIGH (ref <150)
VLDL: 35 mg/dL — ABNORMAL HIGH (ref <30)

## 2016-10-15 LAB — THYROID PANEL WITH TSH
Free Thyroxine Index: 3.1 (ref 1.4–3.8)
T3 Uptake: 35 % (ref 22–35)
T4, Total: 8.8 ug/dL (ref 4.5–12.0)
TSH: 0.67 mIU/L

## 2016-10-17 ENCOUNTER — Telehealth: Payer: Self-pay | Admitting: Family Medicine

## 2016-10-17 MED ORDER — LEVOTHYROXINE SODIUM 50 MCG PO TABS: 50.0000 ug | ORAL_TABLET | Freq: Every day | ORAL | 0 refills | Status: DC

## 2016-10-17 NOTE — Telephone Encounter (Signed)
Please call patient to advise I've reduced her dose of levothyroxine from 75 mcg to 50 mcg due to her TSH level.  Please return in 3 months for repeat TSH level.  Sylvia Pruitt. Kenton Kingfisher, MSN, FNP-C [facility]

## 2016-10-17 NOTE — Telephone Encounter (Signed)
Left a general voicemail regarding advising that lab results received and thyroid medication is available for pick up at the pharmacy.  TSH low normal. Reduced Synthroid dose to 50 mcg.   Sylvia Pruitt. Kenton Kingfisher, MSN, FNP-C [facility]

## 2016-10-17 NOTE — Telephone Encounter (Signed)
I placed an order for add on Hemoglobin A1C for this patient and it never resulted. Please advise if there is still adequate time to run this lab with the blood received on 10/13/16.  Carroll Sage. Kenton Kingfisher, MSN, FNP-C [facility]

## 2016-10-23 NOTE — Telephone Encounter (Signed)
I didn't get this message til now.  I was out of the office when this was sent to me.   Would you like for [place] to call patient back in?

## 2016-10-23 NOTE — Telephone Encounter (Signed)
No. I'll check when she returns for follow-up.

## 2017-03-04 ENCOUNTER — Other Ambulatory Visit: Payer: Self-pay | Admitting: Family Medicine

## 2017-03-05 NOTE — Telephone Encounter (Signed)
Last thyroid panel shows good control.

## 2017-06-08 ENCOUNTER — Ambulatory Visit (INDEPENDENT_AMBULATORY_CARE_PROVIDER_SITE_OTHER): Payer: BC Managed Care – PPO | Admitting: Family Medicine

## 2017-06-08 ENCOUNTER — Encounter: Payer: Self-pay | Admitting: Family Medicine

## 2017-06-08 VITALS — BP 125/74 | HR 56 | Temp 98.4°F | Resp 16 | Ht 71.0 in | Wt 160.6 lb

## 2017-06-08 DIAGNOSIS — R0982 Postnasal drip: Secondary | ICD-10-CM

## 2017-06-08 DIAGNOSIS — E039 Hypothyroidism, unspecified: Secondary | ICD-10-CM

## 2017-06-08 DIAGNOSIS — E785 Hyperlipidemia, unspecified: Secondary | ICD-10-CM

## 2017-06-08 DIAGNOSIS — M25552 Pain in left hip: Secondary | ICD-10-CM

## 2017-06-08 DIAGNOSIS — M79671 Pain in right foot: Secondary | ICD-10-CM | POA: Diagnosis not present

## 2017-06-08 DIAGNOSIS — Z23 Encounter for immunization: Secondary | ICD-10-CM | POA: Diagnosis not present

## 2017-06-08 MED ORDER — ZOSTER VAC RECOMB ADJUVANTED 50 MCG/0.5ML IM SUSR: 0.5000 mL | Freq: Once | INTRAMUSCULAR | 1 refills | Status: AC

## 2017-06-08 MED ORDER — ATORVASTATIN CALCIUM 10 MG PO TABS: 10.0000 mg | ORAL_TABLET | Freq: Every day | ORAL | 1 refills | Status: DC

## 2017-06-08 MED ORDER — LEVOTHYROXINE SODIUM 50 MCG PO TABS: ORAL_TABLET | ORAL | 1 refills | Status: DC

## 2017-06-08 MED ORDER — FLUTICASONE PROPIONATE 50 MCG/ACT NA SUSP: 2.0000 | Freq: Every day | NASAL | 6 refills | Status: DC

## 2017-06-08 MED ORDER — GABAPENTIN 300 MG PO CAPS: 600.0000 mg | ORAL_CAPSULE | Freq: Four times a day (QID) | ORAL | 3 refills | Status: DC

## 2017-06-08 NOTE — Patient Instructions (Addendum)
  For the drainage in the back of the throat, try Flonase nasal spray as allergies are most likely cause. If that does not help in the next few weeks, or any worsening symptoms sooner, return for recheck.  I will check your cholesterol, and if it is elevated, would recommend restarting Lipitor. We can look at first at the numbers off of medication to decide on that medication.  I will check a thyroid test, continue same dose of Synthroid for now.  Follow-up with your physical therapist for left hip pain. Overall your exam is reassuring in the office. If he feels you need to see orthopedics, let me know and I'll be happy to place referral.  Recheck in the next 6 months for a physical, sooner if any worsening symptoms.   IF you received an x-ray today, you will receive an invoice from [facility] Radiology. Please contact [facility] Radiology at [phone] with questions or concerns regarding your invoice.   IF you received labwork today, you will receive an invoice from [place]. Please contact [facility] at [phone] with questions or concerns regarding your invoice.   Our billing staff will not be able to assist you with questions regarding bills from these companies.  You will be contacted with the lab results as soon as they are available. The fastest way to get your results is to activate your My Chart account. Instructions are located on the last page of this paperwork. If you have not heard from [place] regarding the results in 2 weeks, please contact this office.    We recommend that you schedule a mammogram for breast cancer screening. Typically, you do not need a referral to do this. Please contact a local imaging center to schedule your mammogram.  [facility] - [phone]  *ask for the Radiology Department [facility] - [phone] or [phone]  [facility] - [phone] [place] [phone] [facility] - [phone]  *ask for the [facility] - [phone]  *ask for the Radiology Department [facility] - [phone]  *ask for the [place] - [phone]

## 2017-06-08 NOTE — Progress Notes (Addendum)
Subjective:  By signing my name below, I, Sylvia Pruitt, attest that this documentation has been prepared under the direction and in the presence of [name], MD. Electronically Signed: Moises Pruitt, Sylvia Pruitt. 06/08/2017 , 10:40 AM .  Patient was seen in Room 3 .   Patient ID: Sylvia Pruitt, female    DOB: [birth year], 61 y.o.   MRN: [id number] Chief Complaint  Patient presents with  . Medication Refill    levothyroid, gabapentin, pt wanting cholesterol check(pt is fasting) and would like to get the zostavax(shingles) shot   HPI Sylvia Pruitt is a 61 y.o. female Here for medication refill.   Left hip pain She's been followed by Barbaraann Barthel, physical therapist, for left frozen hip. She states that she hasn't had progress with the therapy. She hasn't seen him recently when seen in the past. She notes that it's worsened a few weeks ago and will see him in 2 days. When she takes a stride, she has a shooting pain down her leg.   Scratchy Throat She also reports often clearing her throat with the sensation of having mucus stuck in the back of her throat ongoing for several months now. She states it's more of an irritation. She's been gargling warm salt water and taking claritin for about a week without any change or improvement. She notes it feels predominantly on the right side of her throat with right ear irritation as well. She denies rhinorrhea, drainage, heartburn, belching, burping, night sweats, cough, or unexpected weight loss. She denies using nasal sprays.   Hypothyroidism She is on Synthroid 101mcg QD. She does note some weight gain recently.   Lab Results  Component Value Date   TSH 0.67 10/13/2016    Reflex sympathetic dystrophy of lower limb She takes gabapentin 300mg  2 capsules QID.   Hyperlipidemia Lab Results  Component Value Date   CHOL 209 (H) 10/13/2016   HDL 52 10/13/2016   LDLCALC 122 10/13/2016   TRIG 174 (H) 10/13/2016   CHOLHDL 4.0 10/13/2016    Lab Results  Component Value Date   ALT 10 10/13/2016   AST 14 10/13/2016   ALKPHOS 71 10/13/2016   BILITOT 0.4 10/13/2016   She takes Lipitor 10mg  QHS. She hasn't been taking the Lipitor for a while now because she read about the side effects of affecting her liver. She decided to adjust her diet and will decide if she needs to restart it base on her lab work today.   Vaccines She has history of shingles and requested shingles vaccine.   Patient Active Problem List   Diagnosis Date Noted  . 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 Hypothyroid 03/29/2013  . 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 Other and unspecified hyperlipidemia 03/29/2013   Past Medical History:  Diagnosis Date  . Elevated lipids   . Foot pain, right   . Hypothyroid    No past surgical history on file. No Known Allergies Prior to Admission medications   Medication Sig Start Date End Date Taking? Authorizing Provider  atorvastatin (LIPITOR) 10 MG tablet Take 1 tablet (10 mg total) by mouth daily. 10/13/16  Yes Scot Jun, FNP  gabapentin (NEURONTIN) 300 MG capsule Take 2 capsules (600 mg total) by mouth 4 (four) times daily. 10/13/16  Yes Scot Jun, FNP  levothyroxine (SYNTHROID, LEVOTHROID) 50 MCG tablet TAKE 1 TABLET(50 MCG) BY MOUTH DAILY 03/05/17  Yes Jaynee Eagles, PA-C  cyclobenzaprine (FLEXERIL) 10 MG tablet Take 1 tablet (10 mg total)  by mouth at bedtime as needed for muscle spasms. Patient not taking: Reported on 06/08/2017 09/10/15   Elby Beck, FNP  docusate sodium (COLACE) 100 MG capsule Take 1 capsule (100 mg total) by mouth daily. Patient not taking: Reported on 10/13/2016 08/19/16   Wendie Agreste, MD  oxyCODONE-acetaminophen (ROXICET) 5-325 MG tablet Take 1 tablet by mouth every 8 (eight) hours as needed for severe pain. Patient not taking: Reported on 10/13/2016 08/19/16   Wendie Agreste, MD   Social History   Social History  . Marital status: Single    Spouse name: N/A  . Number  of children: N/A  . Years of education: N/A   Occupational History  . Not on file.   Social History Main Topics  . Smoking status: Never Smoker  . Smokeless tobacco: Never Used  . Alcohol use No  . Drug use: No  . Sexual activity: Not on file   Other Topics Concern  . Not on file   Social History Narrative  . No narrative on file   Review of Systems  Constitutional: Negative for chills, fatigue, fever and unexpected weight change.  HENT: Positive for congestion. Negative for rhinorrhea.   Respiratory: Negative for chest tightness and shortness of breath.   Cardiovascular: Negative for chest pain, palpitations and leg swelling.  Gastrointestinal: Negative for abdominal pain and Pruitt in stool.  Musculoskeletal: Positive for arthralgias.  Neurological: Negative for dizziness, syncope, light-headedness and headaches.       Objective:   Physical Exam  Constitutional: She is oriented to person, place, and time. She appears well-developed and well-nourished. No distress.  HENT:  Head: Normocephalic and atraumatic.  Right Ear: Hearing, tympanic membrane, external ear and ear canal normal.  Left Ear: Hearing, tympanic membrane, external ear and ear canal normal.  Nose: Mucosal edema (L>R) present.  Mouth/Throat: Oropharynx is clear and moist and mucous membranes are normal. No oropharyngeal exudate.  No discharge seen in posterior oropharynx  Eyes: Conjunctivae and EOM are normal. Pupils are equal, round, and reactive to light.  Neck: Carotid bruit is not present.  Cardiovascular: Normal rate, regular rhythm, normal heart sounds and intact distal pulses.   No murmur heard. Pulmonary/Chest: Effort normal and breath sounds normal. No respiratory distress. She has no wheezes. She has no rhonchi.  Abdominal: Soft. She exhibits no pulsatile midline mass. There is no tenderness.  Musculoskeletal:  Leg hip: internal/external rotation pain free, full ROM, pain free straight leg raise;  trochanter bursa non tender, left lower lumbar spine non tender  Neurological: She is alert and oriented to person, place, and time.  Skin: Skin is warm and dry. No rash noted.  Psychiatric: She has a normal mood and affect. Her behavior is normal.  Vitals reviewed.   Vitals:   06/08/17 1008  BP: 125/74  Pulse: (!) 56  Resp: 16  Temp: 98.4 F (36.9 C)  TempSrc: Oral  SpO2: 97%  Weight: 160 lb 9.6 oz (72.8 kg)  Height: 5\' 11"  (1.803 m)      Assessment & Plan:   SHANAY WOOLMAN is a 61 y.o. female PND (post-nasal drip) - Plan: fluticasone (FLONASE) 50 MCG/ACT nasal spray  - Suspected postnasal drip with mucus/throat sensation. No concerning findings on exam. Slight edema of turbinates indicates possible allergic cause. Initial trial of Flonase nasal spray, then recheck in the next few weeks if not improving. Doubt reflux/other upper airway cough syndrome at this time.  Right foot pain - Plan: gabapentin (NEURONTIN)  300 MG capsule  - History of reflex sympathetic dystrophy, stable on current doses of Neurontin. Refill at same dose  Hyperlipidemia, unspecified hyperlipidemia type - Plan: atorvastatin (LIPITOR) 10 MG tablet, Comprehensive metabolic panel, Lipid panel  - Off statin recently. We'll check baseline lipid panel/CMP. Advised patient [address] Lipitor on hold pharmacy until results known and ASCVD risk calculated.  Hypothyroidism, unspecified type - Plan: levothyroxine (SYNTHROID, LEVOTHROID) 50 MCG tablet, TSH  - Check TSH, continue Synthroid  same dose for now.  Left hip pain  - No pain with internal/external rotation. Has upcoming appointment with physical therapist. Advised if orthopedic evaluation needed, or physical therapy referral needed, to let me know and I'll be happy to order.   Need for shingles vaccine, shingrix prescribed  Meds ordered this encounter  Medications  . levothyroxine (SYNTHROID, LEVOTHROID) 50 MCG tablet    Sig: TAKE 1 TABLET(50 MCG) BY  MOUTH DAILY    Dispense:  90 tablet    Refill:  1  . gabapentin (NEURONTIN) 300 MG capsule    Sig: Take 2 capsules (600 mg total) by mouth 4 (four) times daily.    Dispense:  720 capsule    Refill:  3  . atorvastatin (LIPITOR) 10 MG tablet    Sig: Take 1 tablet (10 mg total) by mouth daily at 6 PM.    Dispense:  90 tablet    Refill:  1  . fluticasone (FLONASE) 50 MCG/ACT nasal spray    Sig: Place 2 sprays into both nostrils daily.    Dispense:  16 g    Refill:  6  . Zoster Vac Recomb Adjuvanted [facility]) injection    Sig: Inject 0.5 mLs into the muscle once. Repeat injection once in 2-6 months.    Dispense:  0.5 mL    Refill:  1   Patient Instructions    For the drainage in the back of the throat, try Flonase nasal spray as allergies are most likely cause. If that does not help in the next few weeks, or any worsening symptoms sooner, return for recheck.  I will check your cholesterol, and if it is elevated, would recommend restarting Lipitor. We can look at first at the numbers off of medication to decide on that medication.  I will check a thyroid test, continue same dose of Synthroid for now.  Follow-up with your physical therapist for left hip pain. Overall your exam is reassuring in the office. If he feels you need to see orthopedics, let me know and I'll be happy to place referral.  Recheck in the next 6 months for a physical, sooner if any worsening symptoms.   IF you received an x-ray today, you will receive an invoice from [facility]. Please contact [facility] at [phone] with questions or concerns regarding your invoice.   IF you received labwork today, you will receive an invoice from [place] [facility]. Please contact [facility] at [phone] with questions or concerns regarding your invoice.   Our billing staff will not be able to assist you with questions regarding bills from these companies.  You will be contacted with the lab results as soon as  they are available. The fastest way to get your results is to activate your My Chart account. Instructions are located on the last page of this paperwork. If you have not heard from [place] regarding the results in 2 weeks, please contact this office.    We recommend that you schedule a mammogram for breast cancer screening. Typically,  you do not need a referral to do this. Please contact a local imaging center to schedule your mammogram.  [facility] - [phone]  *ask for the Radiology [place]) - [phone] or [phone]  [facility] - [phone] [place] [phone] [facility] - [phone]  *ask for the [facility] - [phone]  *ask for the Radiology Department [facility] - [phone]  *ask for the [place] - [phone]  I personally performed the services described in this documentation, which was scribed in my presence. The recorded information has been reviewed and considered for accuracy and completeness, addended by me as needed, and agree with information above.  Signed,   [name], MD [facility].  06/08/17 2:44 PM

## 2017-06-09 LAB — COMPREHENSIVE METABOLIC PANEL
ALT: 13 IU/L (ref 0–32)
AST: 18 IU/L (ref 0–40)
Albumin/Globulin Ratio: 1.5 (ref 1.2–2.2)
Albumin: 3.9 g/dL (ref 3.6–4.8)
Alkaline Phosphatase: 81 IU/L (ref 39–117)
BUN/Creatinine Ratio: 16 (ref 12–28)
BUN: 13 mg/dL (ref 8–27)
Bilirubin Total: 0.4 mg/dL (ref 0.0–1.2)
CO2: 24 mmol/L (ref 20–29)
Calcium: 9.2 mg/dL (ref 8.7–10.3)
Chloride: 105 mmol/L (ref 96–106)
Creatinine, Ser: 0.8 mg/dL (ref 0.57–1.00)
GFR calc Af Amer: 93 mL/min/{1.73_m2} (ref >59)
GFR calc non Af Amer: 80 mL/min/{1.73_m2} (ref >59)
Globulin, Total: 2.6 g/dL (ref 1.5–4.5)
Glucose: 90 mg/dL (ref 65–99)
Potassium: 4.1 mmol/L (ref 3.5–5.2)
Sodium: 141 mmol/L (ref 134–144)
Total Protein: 6.5 g/dL (ref 6.0–8.5)

## 2017-06-09 LAB — LIPID PANEL
Chol/HDL Ratio: 3.8 ratio (ref 0.0–4.4)
Cholesterol, Total: 232 mg/dL — ABNORMAL HIGH (ref 100–199)
HDL: 61 mg/dL (ref >39)
LDL Calculated: 157 mg/dL — ABNORMAL HIGH (ref 0–99)
Triglycerides: 70 mg/dL (ref 0–149)
VLDL Cholesterol Cal: 14 mg/dL (ref 5–40)

## 2017-06-09 LAB — TSH: TSH: 2.14 u[IU]/mL (ref 0.450–4.500)

## 2017-07-10 ENCOUNTER — Ambulatory Visit: Payer: BC Managed Care – PPO | Admitting: Family Medicine

## 2017-07-13 ENCOUNTER — Ambulatory Visit: Payer: BC Managed Care – PPO | Admitting: Family Medicine

## 2017-11-17 ENCOUNTER — Encounter: Payer: Self-pay | Admitting: Family Medicine

## 2017-11-17 ENCOUNTER — Other Ambulatory Visit: Payer: Self-pay

## 2017-11-17 ENCOUNTER — Ambulatory Visit: Payer: BC Managed Care – PPO | Admitting: Family Medicine

## 2017-11-17 VITALS — BP 122/78 | HR 64 | Temp 98.5°F | Resp 18 | Ht 71.0 in | Wt 157.4 lb

## 2017-11-17 DIAGNOSIS — F458 Other somatoform disorders: Secondary | ICD-10-CM

## 2017-11-17 DIAGNOSIS — R0989 Other specified symptoms and signs involving the circulatory and respiratory systems: Secondary | ICD-10-CM

## 2017-11-17 DIAGNOSIS — R198 Other specified symptoms and signs involving the digestive system and abdomen: Secondary | ICD-10-CM

## 2017-11-17 MED ORDER — OMEPRAZOLE 20 MG PO CPDR: 20.0000 mg | DELAYED_RELEASE_CAPSULE | Freq: Every day | ORAL | 1 refills | Status: DC

## 2017-11-17 NOTE — Patient Instructions (Addendum)
Ok to try omeprazole once per day to see if some of your symptoms are due to reflux,  but I would also like you to meet with ear nose and throat specialist. I will place a referral. Return to the clinic or go to the nearest emergency room if any of your symptoms worsen or new symptoms occur. Let me know if there are questions in the meantime. Thanks for coming in today.    IF you received an x-ray today, you will receive an invoice from [facility]. Please contact [facility] at [phone] with questions or concerns regarding your invoice.   IF you received labwork today, you will receive an invoice from [facility]. Please contact [facility] at [phone] with questions or concerns regarding your invoice.   Our billing staff will not be able to assist you with questions regarding bills from these companies.  You will be contacted with the lab results as soon as they are available. The fastest way to get your results is to activate your My Chart account. Instructions are located on the last page of this paperwork. If you have not heard from [place] regarding the results in 2 weeks, please contact this office.

## 2017-11-17 NOTE — Progress Notes (Signed)
Subjective:  By signing my name below, I, Essence Howell, attest that this documentation has been prepared under the direction and in the presence of Wendie Agreste, MD Electronically Signed: Ladene Artist, ED Scribe 11/17/2017 at 5:40 PM.   Patient ID: Sylvia Pruitt, female    DOB: May 05, 1956, 61 y.o.   MRN: [id number]  Chief Complaint  Patient presents with  . Sore Throat    has mucus in throat pt states it started in march and heard of silent reflux    HPI Sylvia Pruitt is a 61 y.o. female who presents to Primary Care at [facility] complaining of a globus sensation and gradually worsening throat irritation for several months. Pt reported similar symptoms at [place] in June. She reports some postnasal drip since the summer and clear rhinorrhea over the past few weeks. She reports that symptoms begin in the morning and gradually worsens as the day progresses. She notes some temporary improvement only with throat lozenges bu states symptoms return when lozenge dissolves. Pt has tried gargling salt water, Flonase, Claritin, Allegra, Zyrtec without any relief. Denies difficulty swallowing, choking, cough, vomiting, heartburn, abdominal pain, hoarseness, unexplained weight loss, night sweats, fever, HAs, bumps/lumps to neck, second hand smoke exposure, chemical exposure.  Patient Active Problem List   Diagnosis Date Noted  . 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 Hypothyroid 03/29/2013  . 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 Other and unspecified hyperlipidemia 03/29/2013   Past Medical History:  Diagnosis Date  . Elevated lipids   . Foot pain, right   . Hypothyroid    No past surgical history on file. No Known Allergies Prior to Admission medications   Medication Sig Start Date End Date Taking? Authorizing Provider  atorvastatin (LIPITOR) 10 MG tablet Take 1 tablet (10 mg total) by mouth daily at 6 PM. 06/08/17   Wendie Agreste, MD  fluticasone [facility]) 50 MCG/ACT nasal spray Place 2 sprays into both  nostrils daily. 06/08/17   Wendie Agreste, MD  gabapentin (NEURONTIN) 300 MG capsule Take 2 capsules (600 mg total) by mouth 4 (four) times daily. 06/08/17   Wendie Agreste, MD  levothyroxine (SYNTHROID, LEVOTHROID) 50 MCG tablet TAKE 1 TABLET(50 MCG) BY MOUTH DAILY 06/08/17   Wendie Agreste, MD   Social History   Socioeconomic History  . Marital status: Single    Spouse name: Not on file  . Number of children: Not on file  . Years of education: Not on file  . Highest education level: Not on file  Social Needs  . Financial resource strain: Not on file  . Food insecurity - worry: Not on file  . Food insecurity - inability: Not on file  . Transportation needs - medical: Not on file  . Transportation needs - non-medical: Not on file  Occupational History  . Not on file  Tobacco Use  . Smoking status: Never Smoker  . Smokeless tobacco: Never Used  Substance and Sexual Activity  . Alcohol use: No  . Drug use: No  . Sexual activity: Not on file  Other Topics Concern  . Not on file  Social History Narrative  . Not on file   Review of Systems  Constitutional: Negative for diaphoresis, fever and unexpected weight change.  HENT: Positive for postnasal drip, rhinorrhea and sore throat (irritation). Negative for trouble swallowing and voice change.   Respiratory: Negative for cough and choking.   Gastrointestinal: Negative for abdominal pain and vomiting.  Neurological: Negative for headaches.  Objective:   Physical Exam  Constitutional: She is oriented to person, place, and time. She appears well-developed and well-nourished. No distress.  HENT:  Head: Atraumatic. Macrocephalic.  Right Ear: Hearing, tympanic membrane, external ear and ear canal normal.  Left Ear: Hearing, tympanic membrane, external ear and ear canal normal.  Nose: Nose normal.  Mouth/Throat: Uvula is midline and oropharynx is clear and moist. No oropharyngeal exudate.  Speaking normally.  Eyes:  Conjunctivae and EOM are normal. Pupils are equal, round, and reactive to light.  Neck:  Neck appears normal. No lymphadenopathy.  Cardiovascular: Normal rate, regular rhythm, normal heart sounds and intact distal pulses.  No murmur heard. Pulmonary/Chest: Effort normal and breath sounds normal. No stridor. No respiratory distress. She has no wheezes. She has no rhonchi.  Lymphadenopathy:    She has no cervical adenopathy.  Neurological: She is alert and oriented to person, place, and time.  Skin: Skin is warm and dry. No rash noted.  Psychiatric: She has a normal mood and affect. Her behavior is normal.  Vitals reviewed.  Vitals:   11/17/17 1708  BP: 122/78  Pulse: 64  Resp: 18  Temp: 98.5 F (36.9 C)  TempSrc: Oral  SpO2: 97%  Weight: 157 lb 6.4 oz (71.4 kg)  Height: 5\' 11"  (1.803 m)      Assessment & Plan:   Sylvia Pruitt is a 61 y.o. female Globus sensation - Plan: Ambulatory referral to ENT, omeprazole (PRILOSEC) 20 MG capsule  - . 6 months of persistent globus sensation/mucus sensation in throat. Denies liquid or solid dysphagia, denies other systemic symptoms. Symptoms have persisted as well as increased to where she feels the symptoms throughout the day. No significant change with treatment of allergic rhinitis/postnasal drip.  - Trial of omeprazole for possible laryngeal pharyngeal reflux/upper airway irritation.  -Refer to ear nose and throat for consideration of laryngoscopy.  - Other imaging deferred at present, RTC precautions if acute worsening  Meds ordered this encounter  Medications  . omeprazole (PRILOSEC) 20 MG capsule    Sig: Take 1 capsule (20 mg total) by mouth daily.    Dispense:  30 capsule    Refill:  1   Patient Instructions   Ok to try omeprazole once per day to see if some of your symptoms are due to reflux,  but I would also like you to meet with ear nose and throat specialist. I will place a referral. Return to the clinic or go to the nearest  emergency room if any of your symptoms worsen or new symptoms occur. Let me know if there are questions in the meantime. Thanks for coming in today.    IF you received an x-ray today, you will receive an invoice from [facility]. Please contact [facility] at [phone] with questions or concerns regarding your invoice.   IF you received labwork today, you will receive an invoice from [place]. Please contact [facility] at [phone] with questions or concerns regarding your invoice.   Our billing staff will not be able to assist you with questions regarding bills from these companies.  You will be contacted with the lab results as soon as they are available. The fastest way to get your results is to activate your My Chart account. Instructions are located on the last page of this paperwork. If you have not heard from [place] regarding the results in 2 weeks, please contact this office.      I personally performed the services described in this  documentation, which was scribed in my presence. The recorded information has been reviewed and considered for accuracy and completeness, addended by me as needed, and agree with information above.  Signed,   [name], MD [facility] at [place].  11/17/17 6:02 PM

## 2017-11-27 ENCOUNTER — Encounter: Payer: Self-pay | Admitting: Family Medicine

## 2017-11-27 ENCOUNTER — Ambulatory Visit: Payer: BC Managed Care – PPO | Admitting: Family Medicine

## 2017-11-27 ENCOUNTER — Other Ambulatory Visit: Payer: Self-pay

## 2017-11-27 VITALS — BP 108/66 | HR 66 | Temp 98.9°F | Resp 18 | Ht 71.42 in | Wt 157.4 lb

## 2017-11-27 DIAGNOSIS — R319 Hematuria, unspecified: Secondary | ICD-10-CM

## 2017-11-27 DIAGNOSIS — R109 Unspecified abdominal pain: Secondary | ICD-10-CM

## 2017-11-27 LAB — POCT CBC
Granulocyte percent: 88.7 %G — AB (ref 37–80)
HCT, POC: 42.2 % (ref 37.7–47.9)
Hemoglobin: 14.3 g/dL (ref 12.2–16.2)
Lymph, poc: 0.9 (ref 0.6–3.4)
MCH, POC: 30.9 pg (ref 27–31.2)
MCHC: 33.9 g/dL (ref 31.8–35.4)
MCV: 91.1 fL (ref 80–97)
MID (cbc): 0.2 (ref 0–0.9)
MPV: 7.7 fL (ref 0–99.8)
POC Granulocyte: 8.7 — AB (ref 2–6.9)
POC LYMPH PERCENT: 8.9 %L — AB (ref 10–50)
POC MID %: 2.4 %M (ref 0–12)
Platelet Count, POC: 228 10*3/uL (ref 142–424)
RBC: 4.63 M/uL (ref 4.04–5.48)
RDW, POC: 12.7 %
WBC: 9.8 10*3/uL (ref 4.6–10.2)

## 2017-11-27 LAB — POCT URINALYSIS DIP (MANUAL ENTRY)
Glucose, UA: NEGATIVE mg/dL
Ketones, POC UA: NEGATIVE mg/dL
Nitrite, UA: NEGATIVE
Protein Ur, POC: 30 mg/dL — AB
Spec Grav, UA: >=1.03 — AB (ref 1.010–1.025)
Urobilinogen, UA: 0.2 E.U./dL
pH, UA: 6 (ref 5.0–8.0)

## 2017-11-27 LAB — POC MICROSCOPIC URINALYSIS (UMFC): Mucus: ABSENT

## 2017-11-27 NOTE — Progress Notes (Signed)
12/14/20189:43 AM  Sylvia Pruitt 1956/04/18, 61 y.o. female [id number]  Chief Complaint  Patient presents with  . Flank Pain    X 1 day- on left side  . Emesis    HPI:   Patient is a 61 y.o. female with who presents today for sharp left flank pain that started yesterday. It started as an intermittent sharp stabbing pain, not so intense, thought it was muscular, tried stretching it out. However this morning at 5am she was woken by severe pain, now radiating up her left flank and also across her left lower abdomen, nauseous, very burpy. Took 2 ibuprofen, vomited afterwards, but was able to dose off to sleep again. Now flank pain better. No more nausea, no fever or chills, no dysuria or hematuria, normal BM. Had similar episode in 2015, right side, thought to be nephrolithiasis vs early pyelo, no imagining done other than KUB. Her father might have had kidney stones.  Depression screen [facility] 2/9 11/27/2017 11/17/2017 06/08/2017  Decreased Interest 0 0 0  Down, Depressed, Hopeless 0 0 0  PHQ - 2 Score 0 0 0    No Known Allergies  Prior to Admission medications   Medication Sig Start Date End Date Taking? Authorizing Provider  gabapentin (NEURONTIN) 300 MG capsule Take 2 capsules (600 mg total) by mouth 4 (four) times daily. 06/08/17  Yes Wendie Agreste, MD  levothyroxine (SYNTHROID, LEVOTHROID) 50 MCG tablet TAKE 1 TABLET(50 MCG) BY MOUTH DAILY 06/08/17  Yes Wendie Agreste, MD  omeprazole (PRILOSEC) 20 MG capsule Take 1 capsule (20 mg total) by mouth daily. 11/17/17  Yes Wendie Agreste, MD    Past Medical History:  Diagnosis Date  . Elevated lipids   . Foot pain, right   . Hypothyroid     History reviewed. No pertinent surgical history.  Social History   Tobacco Use  . Smoking status: Never Smoker  . Smokeless tobacco: Never Used  Substance Use Topics  . Alcohol use: No    Family History  Problem Relation Age of Onset  . Heart disease Father     ROS Per  hpi  OBJECTIVE:  Blood pressure 108/66, pulse 66, temperature 98.9 F (37.2 C), temperature source Oral, resp. rate 18, height 5' 11.42" (1.814 m), weight 157 lb 6.4 oz (71.4 kg), SpO2 99 %.  Physical Exam  Constitutional: She is oriented to person, place, and time and well-developed, well-nourished, and in no distress.  HENT:  Head: Normocephalic and atraumatic.  Mouth/Throat: Oropharynx is clear and moist. No oropharyngeal exudate.  Eyes: EOM are normal. Pupils are equal, round, and reactive to light. No scleral icterus.  Neck: Neck supple.  Cardiovascular: Normal rate, regular rhythm and normal heart sounds. Exam reveals no gallop and no friction rub.  No murmur heard. Pulmonary/Chest: Effort normal and breath sounds normal. She has no wheezes. She has no rales.  Abdominal: Soft. Bowel sounds are normal. She exhibits no distension. There is tenderness in the suprapubic area. There is no rebound, no guarding and no CVA tenderness.  Musculoskeletal: She exhibits no edema.  Neurological: She is alert and oriented to person, place, and time. Gait normal.  Skin: Skin is warm and dry.    Results for orders placed or performed in visit on 11/27/17 (from the past 24 hour(s))  POCT urinalysis dipstick     Status: Abnormal   Collection Time: 11/27/17 10:26 AM  Result Value Ref Range   Color, UA yellow yellow   Clarity, UA  cloudy (A) clear   Glucose, UA negative negative mg/dL   Bilirubin, UA small (A) negative   Ketones, POC UA negative negative mg/dL   Spec Grav, UA >=1.030 (A) 1.010 - 1.025   Blood, UA large (A) negative   pH, UA 6.0 5.0 - 8.0   Protein Ur, POC =30 (A) negative mg/dL   Urobilinogen, UA 0.2 0.2 or 1.0 E.U./dL   Nitrite, UA Negative Negative   Leukocytes, UA Trace (A) Negative  POCT CBC     Status: Abnormal   Collection Time: 11/27/17 10:40 AM  Result Value Ref Range   WBC 9.8 4.6 - 10.2 K/uL   Lymph, poc 0.9 0.6 - 3.4   POC LYMPH PERCENT 8.9 (A) 10 - 50 %L    MID (cbc) 0.2 0 - 0.9   POC MID % 2.4 0 - 12 %M   POC Granulocyte 8.7 (A) 2 - 6.9   Granulocyte percent 88.7 (A) 37 - 80 %G   RBC 4.63 4.04 - 5.48 M/uL   Hemoglobin 14.3 12.2 - 16.2 g/dL   HCT, POC 42.2 37.7 - 47.9 %   MCV 91.1 80 - 97 fL   MCH, POC 30.9 27 - 31.2 pg   MCHC 33.9 31.8 - 35.4 g/dL   RDW, POC 12.7 %   Platelet Count, POC 228 142 - 424 K/uL   MPV 7.7 0 - 99.8 fL  POCT Microscopic Urinalysis (UMFC)     Status: Abnormal   Collection Time: 11/27/17 10:47 AM  Result Value Ref Range   WBC,UR,HPF,POC Few (A) None WBC/hpf   RBC,UR,HPF,POC Many (A) None RBC/hpf   Bacteria Few (A) None, Too numerous to count   Mucus Absent Absent   Epithelial Cells, UR Per Microscopy Few (A) None, Too numerous to count cells/hpf     ASSESSMENT and PLAN  1. Flank pain Strongly suggestive of nephrolithiasis, seems to be passing, cont with ibuprofen as needed, ordering CT scan to evaluate for presence of other stones as this seems to be second episode. Discussed supportive measures, RTC precautions, patient educational handout given. - POCT urinalysis dipstick - POCT Microscopic Urinalysis (UMFC) - POCT CBC - Urine Culture - CT ABDOMEN PELVIS WO CONTRAST; Future  2. Hematuria, unspecified type - CT ABDOMEN PELVIS WO CONTRAST; Future  Return if symptoms worsen or fail to improve.    Rutherford Guys, MD Primary Care at [place], [place] [address] Ph.  [phone] Fax [phone]

## 2017-11-27 NOTE — Patient Instructions (Addendum)
IF you received an x-ray today, you will receive an invoice from [facility]. Please contact [facility] at [phone] with questions or concerns regarding your invoice.   IF you received labwork today, you will receive an invoice from [place]. Please contact [facility] at [phone] with questions or concerns regarding your invoice.   Our billing staff will not be able to assist you with questions regarding bills from these companies.  You will be contacted with the lab results as soon as they are available. The fastest way to get your results is to activate your My Chart account. Instructions are located on the last page of this paperwork. If you have not heard from [place] regarding the results in 2 weeks, please contact this office.     Kidney Stones Kidney stones (urolithiasis) are solid, rock-like deposits that form inside of the organs that make urine (kidneys). A kidney stone may form in a kidney and move into the bladder, where it can cause intense pain and block the flow of urine. Kidney stones are created when high levels of certain minerals are found in the urine. They are usually passed through urination, but in some cases, medical treatment may be needed to remove them. What are the causes? Kidney stones may be caused by:  A condition in which certain glands produce too much parathyroid hormone (primary hyperparathyroidism), which causes too much calcium buildup in the blood.  Buildup of uric acid crystals in the bladder (hyperuricosuria). Uric acid is a chemical that the body produces when you eat certain foods. It usually exits the body in the urine.  Narrowing (stricture) of one or both of the tubes that drain urine from the kidneys to the bladder (ureters).  A kidney blockage that is present at birth (congenital obstruction).  Past surgery on the kidney or the ureters, such as gastric bypass surgery.  What increases the risk? The following factors  make you more likely to develop kidney stones:  Having had a kidney stone in the past.  Having a family history of kidney stones.  Not drinking enough water.  Eating a diet that is high in protein, salt (sodium), or sugar.  Being overweight or obese.  What are the signs or symptoms? Symptoms of a kidney stone may include:  Nausea.  Vomiting.  Blood in the urine (hematuria).  Pain in the side of the abdomen, right below the ribs (flank pain). Pain usually spreads (radiates) to the groin.  Needing to urinate frequently or urgently.  How is this diagnosed? This condition may be diagnosed based on:  Your medical history.  A physical exam.  Blood tests.  Urine tests.  CT scan.  Abdominal X-ray.  A procedure to examine the inside of the bladder (cystoscopy).  How is this treated? Treatment for kidney stones depends on the size, location, and makeup of the stones. Treatment may involve:  Analyzing your urine before and after you pass the stone through urination.  Being monitored at the hospital until you pass the stone through urination.  Increasing your fluid intake and decreasing the amount of calcium and protein in your diet.  A procedure to break up kidney stones in the bladder using: ? A focused beam of light (laser therapy). ? Shock waves (extracorporeal shock wave lithotripsy).  Surgery to remove kidney stones. This may be needed if you have severe pain or have stones that block your urinary tract.  Follow these instructions at home: Eating and drinking   Drink  enough fluid to keep your urine clear or pale yellow. This will help you to pass the kidney stone.  If directed, change your diet. This may include: ? Limiting how much sodium you eat. ? Eating more fruits and vegetables. ? Limiting how much meat, poultry, fish, and eggs you eat.  Follow instructions from your health care provider about eating or drinking restrictions. General  instructions  Collect urine samples as told by your health care provider. You may need to collect a urine sample: ? 24 hours after you pass the stone. ? 8-12 weeks after passing the kidney stone, and every 6-12 months after that.  Strain your urine every time you urinate, for as long as directed. Use the strainer that your health care provider recommends.  Do not throw out the kidney stone after passing it. Keep the stone so it can be tested by your health care provider. Testing the makeup of your kidney stone may help prevent you from getting kidney stones in the future.  Take over-the-counter and prescription medicines only as told by your health care provider.  Keep all follow-up visits as told by your health care provider. This is important. You may need follow-up X-rays or ultrasounds to make sure that your stone has passed. How is this prevented? To prevent another kidney stone:  Drink enough fluid to keep your urine clear or pale yellow. This is the best way to prevent kidney stones.  Eat a healthy diet and follow recommendations from your health care provider about foods to avoid. You may be instructed to eat a low-protein diet. Recommendations vary depending on the type of kidney stone that you have.  Maintain a healthy weight.  Contact a health care provider if:  You have pain that gets worse or does not get better with medicine. Get help right away if:  You have a fever or chills.  You develop severe pain.  You develop new abdominal pain.  You faint.  You are unable to urinate. This information is not intended to replace advice given to you by your health care provider. Make sure you discuss any questions you have with your health care provider. Document Released: 12/01/2005 Document Revised: 06/20/2016 Document Reviewed: 05/16/2016 Elsevier Interactive Patient Education  2017 [employer].

## 2017-11-28 LAB — URINE CULTURE: Organism ID, Bacteria: NO GROWTH

## 2017-12-02 ENCOUNTER — Telehealth: Payer: Self-pay | Admitting: Family Medicine

## 2017-12-02 DIAGNOSIS — N200 Calculus of kidney: Secondary | ICD-10-CM

## 2017-12-02 NOTE — Telephone Encounter (Signed)
Pt's CT Abdomen and Pelvis has not been approved with [employer]. It is under review and case is to close on 12/04/17. AIM can be contacted at [phone] to provide more information. The statement AIM gave for nor approving this CT at the moment is, "CT for evaluation of recurrent urinary tract calculi is limited to evaluation of radiolucent stones, radiopaque stones with atypical presentation, or radiopaque stones with typical presentation and nondiagnostic ultrasound."

## 2017-12-03 NOTE — Telephone Encounter (Signed)
Forget about the CT scan. Learning insurance preference here in [place]. I have ordered an ultrasound. Thanks

## 2018-03-27 ENCOUNTER — Other Ambulatory Visit: Payer: Self-pay | Admitting: Family Medicine

## 2018-03-27 DIAGNOSIS — E039 Hypothyroidism, unspecified: Secondary | ICD-10-CM

## 2018-03-29 ENCOUNTER — Telehealth: Payer: Self-pay | Admitting: *Deleted

## 2018-03-29 ENCOUNTER — Other Ambulatory Visit: Payer: Self-pay | Admitting: *Deleted

## 2018-03-29 DIAGNOSIS — E039 Hypothyroidism, unspecified: Secondary | ICD-10-CM

## 2018-03-29 MED ORDER — LEVOTHYROXINE SODIUM 50 MCG PO TABS: ORAL_TABLET | ORAL | 0 refills | Status: DC

## 2018-03-29 NOTE — Telephone Encounter (Signed)
Called prescription in advised patient will need office visit for 90 day supply

## 2018-04-20 ENCOUNTER — Ambulatory Visit: Payer: BC Managed Care – PPO | Admitting: Family Medicine

## 2018-05-04 ENCOUNTER — Other Ambulatory Visit: Payer: Self-pay

## 2018-05-04 ENCOUNTER — Encounter: Payer: Self-pay | Admitting: Family Medicine

## 2018-05-04 ENCOUNTER — Ambulatory Visit: Payer: BC Managed Care – PPO | Admitting: Family Medicine

## 2018-05-04 VITALS — BP 110/68 | HR 62 | Temp 97.9°F | Ht 71.0 in | Wt 154.8 lb

## 2018-05-04 DIAGNOSIS — Z1159 Encounter for screening for other viral diseases: Secondary | ICD-10-CM | POA: Diagnosis not present

## 2018-05-04 DIAGNOSIS — F458 Other somatoform disorders: Secondary | ICD-10-CM

## 2018-05-04 DIAGNOSIS — R0989 Other specified symptoms and signs involving the circulatory and respiratory systems: Secondary | ICD-10-CM

## 2018-05-04 DIAGNOSIS — M79671 Pain in right foot: Secondary | ICD-10-CM

## 2018-05-04 DIAGNOSIS — Z23 Encounter for immunization: Secondary | ICD-10-CM | POA: Diagnosis not present

## 2018-05-04 DIAGNOSIS — R198 Other specified symptoms and signs involving the digestive system and abdomen: Secondary | ICD-10-CM

## 2018-05-04 DIAGNOSIS — E039 Hypothyroidism, unspecified: Secondary | ICD-10-CM | POA: Diagnosis not present

## 2018-05-04 MED ORDER — LEVOTHYROXINE SODIUM 50 MCG PO TABS: ORAL_TABLET | ORAL | 2 refills | Status: DC

## 2018-05-04 MED ORDER — GABAPENTIN 300 MG PO CAPS: 600.0000 mg | ORAL_CAPSULE | Freq: Four times a day (QID) | ORAL | 3 refills | Status: DC

## 2018-05-04 MED ORDER — LEVOTHYROXINE SODIUM 50 MCG PO TABS: ORAL_TABLET | ORAL | 0 refills | Status: DC

## 2018-05-04 NOTE — Patient Instructions (Addendum)
For persistent feeling of mucus in throat, I will order an ultrasound. If that is normal, I would recommend following up with Dr. Janace Hoard to decide on barium swallow, PH probe or other testing.   No change in thyroid or gabapentin dose for now.   I recommend colonoscopy or Cologuard for colon cancer screening. Let me know if I can order either of those procedures.   Please schedule physical in next few months.  Can perform pap testing for cervical cancer screening at that time as well as fasting labs for cholesterol.   We recommend that you schedule a mammogram for breast cancer screening. Typically, you do not need a referral to do this. Please contact a local imaging center to schedule your mammogram.  Thanks for coming in today.   [facility] - [phone]  *ask for the Radiology Department [facility] - [phone] or [phone]  [facility] - [phone] [place] [phone] [facility] - [phone]  *ask for the [facility] - [phone]  *ask for the Radiology Department [facility] - [phone]  *ask for the Mammography Department [facility] - [phone]   IF you received an x-ray today, you will receive an invoice from [facility]. Please contact [facility] Radiology at [phone] with questions or concerns regarding your invoice.   IF you received labwork today, you will receive an invoice from [place]. Please contact [facility] at [phone] with questions or concerns regarding your invoice.   Our billing staff will not be able to assist you with questions regarding bills from these companies.  You will be contacted with the lab results as soon as they are available. The fastest way to get your results is to activate your My Chart account. Instructions are located on the last page of this paperwork. If you have not  heard from [place] regarding the results in 2 weeks, please contact this office.

## 2018-05-04 NOTE — Progress Notes (Signed)
Subjective:  By signing my name below, I, Essence Howell, attest that this documentation has been prepared under the direction and in the presence of Wendie Agreste, MD Electronically Signed: Ladene Artist, ED Scribe 05/04/2018 at 5:25 PM.   Patient ID: Sylvia Pruitt, female    DOB: 14-Nov-1956, 62 y.o.   MRN: [id number]  Chief Complaint  Patient presents with  . Medication Refill    synthroid and Gabapentin refill request   HPI Sylvia Pruitt is a 62 y.o. female who presents to Primary Care at [facility] for f/u with med refills.  Hypothyroidism Synthroid 50 mcg qd. - Denies changes in weight, skin, hair, nails, heat/cold intolerance. 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 Gabapentin 600 mg qid. - Denies side-effects from meds.   Globus Sensation Pt saw Dr. Janace Hoard ENT on 1/18 for globus sensation. Advised that she increase Protonix. Suggested barium swallow with PH probe as well as Thyroid US to rule out nodules which were deferred. F/u in few wks or months if continued symptoms. - Pt states that she is still having a constant globus sensation. Denies throat clearing, hoarseness. She has not scheduled a f/u appointment with Dr. Janace Hoard.  Health Maintenance Tdap: updated today. Hep C Screening: today; declines HIV screening Breast CA Screening: Last mammogram was 01/2016. Cervical CA Screening: Last pap smear was 10/2013; pt does not currently have an OB/GYN. No h/o hysterectomy. Colon CA Screening: She has never had a colonoscopy and declines referral for colonoscopy. Wants to think about cologuard.  Hyperlipidemia Lab Results  Component Value Date   CHOL 232 (H) 06/08/2017   HDL 61 06/08/2017   LDLCALC 157 (H) 06/08/2017   TRIG 70 06/08/2017   CHOLHDL 3.8 06/08/2017    Lab Results  Component Value Date   ALT 13 06/08/2017   AST 18 06/08/2017   ALKPHOS 81 06/08/2017   BILITOT 0.4 06/08/2017  Had discussed low dose Lipitor or rpt testing in 3-6 months. Pt is not fasting at this visit.  Patient Active Problem List   Diagnosis Date Noted  . 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 Hypothyroid 03/29/2013  . 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 Other and unspecified hyperlipidemia 03/29/2013   Past Medical History:  Diagnosis Date  . Elevated lipids   . Foot pain, right   . Hypothyroid    No past surgical history on file. No Known Allergies Prior to Admission medications   Medication Sig Start Date End Date Taking? Authorizing Provider  gabapentin (NEURONTIN) 300 MG capsule Take 2 capsules (600 mg total) by mouth 4 (four) times daily. 06/08/17   Wendie Agreste, MD  levothyroxine (SYNTHROID, LEVOTHROID) 50 MCG tablet TAKE 1 TABLET(50 MCG) BY MOUTH DAILY Office visit needed 03/29/18   Wendie Agreste, MD  omeprazole (PRILOSEC) 20 MG capsule Take 1 capsule (20 mg total) by mouth daily. 11/17/17   Wendie Agreste, MD   Social History   Socioeconomic History  . Marital status: Single    Spouse name: Not on file  . Number of children: Not on file  . Years of education: Not on file  . Highest education level: Not on file  Occupational History  . Not on file  Social Needs  .  Financial resource strain: Not on file  . Food insecurity:    Worry: Not on file    Inability: Not on file  . Transportation needs:    Medical: Not on file    Non-medical: Not on file  Tobacco Use  . Smoking status: Never Smoker  . Smokeless tobacco: Never Used  Substance and Sexual Activity  . Alcohol use: No  . Drug use: No  . Sexual activity: Not on file  Lifestyle  . Physical activity:    Days per week: Not on file    Minutes per session: Not on file  . Stress: Not on file  Relationships  . Social connections:    Talks on phone: Not on file    Gets together: Not on  file    Attends religious service: Not on file    Active member of club or organization: Not on file    Attends meetings of clubs or organizations: Not on file    Relationship status: Not on file  . Intimate partner violence:    Fear of current or ex partner: Not on file    Emotionally abused: Not on file    Physically abused: Not on file    Forced sexual activity: Not on file  Other Topics Concern  . Not on file  Social History Narrative  . Not on file   Review of Systems  Constitutional: Negative for unexpected weight change.  HENT: Negative for voice change.        + Globus sensation  Endocrine: Negative for cold intolerance and heat intolerance.  Skin: Negative for color change.      Objective:   Physical Exam  Constitutional: She is oriented to person, place, and time. She appears well-developed and well-nourished. No distress.  HENT:  Head: Normocephalic and atraumatic.  Eyes: Pupils are equal, round, and reactive to light. Conjunctivae and EOM are normal.  Neck: Neck supple. Carotid bruit is not present. No tracheal deviation present. No thyroid mass and no thyromegaly present.  No significant enlargement.  Cardiovascular: Normal rate, regular rhythm, normal heart sounds and intact distal pulses.  Pulmonary/Chest: Effort normal and breath sounds normal. No respiratory distress.  Abdominal: Soft. She exhibits no pulsatile midline mass. There is no tenderness.  Musculoskeletal: Normal range of motion.  Neurological: She is alert and oriented to person, place, and time.  Skin: Skin is warm and dry.  Psychiatric: She has a normal mood and affect. Her behavior is normal.  Nursing note and vitals reviewed.  Vitals:   05/04/18 1708  BP: 110/68  Pulse: 62  Temp: 97.9 F (36.6 C)  TempSrc: Oral  SpO2: 97%  Weight: 154 lb 12.8 oz (70.2 kg)  Height: 5\' 11"  (1.803 m)      Assessment & Plan:   Sylvia Pruitt is a 62 y.o. female Hypothyroidism, unspecified type - Plan:  US Soft Tissue Head/Neck, levothyroxine (SYNTHROID, LEVOTHROID) 50 MCG tablet, DISCONTINUED: levothyroxine (SYNTHROID, LEVOTHROID) 50 MCG tablet Globus sensation - Plan: US Soft Tissue Head/Neck  -Check TSH, continue same dose levothyroxine for now.  Will check ultrasound of thyroid to rule out nodule/significant thyromegaly as cause of globus sensation.  if normal would follow back up with ENT to decide on pH testing versus bearing swallow for further evaluation of globus sensation.  Right foot pain - Plan: gabapentin (NEURONTIN) 300 MG capsule  -Stable/controlled with use of gabapentin without reported side effects at current dosing.  Refilled.  Need for Tdap vaccination - Plan: Tdap  vaccine greater than or equal to 7yo IM given  Need for hepatitis C screening test - Plan: Hepatitis C antibody  Health maintenance  -Colon cancer screening options discussed, recommend either colonoscopy or Cologuard.  She would like to think about these options first, advised to let me know if referral is needed.  -Plan on follow-up for physical and cervical cancer screening.  -Recommended she call to schedule mammogram  Meds ordered this encounter  Medications  . DISCONTD: levothyroxine (SYNTHROID, LEVOTHROID) 50 MCG tablet    Sig: TAKE 1 TABLET(50 MCG) BY MOUTH DAILY Office visit needed    Dispense:  30 tablet    Refill:  0  . gabapentin (NEURONTIN) 300 MG capsule    Sig: Take 2 capsules (600 mg total) by mouth 4 (four) times daily.    Dispense:  720 capsule    Refill:  3  . levothyroxine (SYNTHROID, LEVOTHROID) 50 MCG tablet    Sig: TAKE 1 TABLET(50 MCG) BY MOUTH DAILY    Dispense:  90 tablet    Refill:  2   Patient Instructions   For persistent feeling of mucus in throat, I will order an ultrasound. If that is normal, I would recommend following up with Dr. Janace Hoard to decide on barium swallow, PH probe or other testing.   No change in thyroid or gabapentin dose for now.   I recommend  colonoscopy or Cologuard for colon cancer screening. Let me know if I can order either of those procedures.   Please schedule physical in next few months.  Can perform pap testing for cervical cancer screening at that time as well as fasting labs for cholesterol.   We recommend that you schedule a mammogram for breast cancer screening. Typically, you do not need a referral to do this. Please contact a local imaging center to schedule your mammogram.  Thanks for coming in today.   [facility] - [phone]  *ask for the Radiology Department [facility] - [phone] or [phone]  [facility] - [phone] [place] [phone] [facility] - [phone]  *ask for the [facility] - [phone]  *ask for the Radiology Department [facility] - [phone]  *ask for the Mammography Department [facility] - [phone]   IF you received an x-ray today, you will receive an invoice from [facility] Rad[facility]. Please contact [facility] Radiology at [phone] with questions or concerns regarding your invoice.   IF you received labwork today, you will receive an invoice from [place]. Please contact [facility] at [phone] with questions or concerns regarding your invoice.   Our billing staff will not be able to assist you with questions regarding bills from these companies.  You will be contacted with the lab results as soon as they are available. The fastest way to get your results is to activate your My Chart account. Instructions are located on the last page of this paperwork. If you have not heard from [place] regarding the results in 2 weeks, please contact this office.       I personally performed the services described in this documentation, which was scribed in my presence. The recorded information has been reviewed and considered for accuracy  and completeness, addended by me as needed, and agree with information above.  Signed,   [name], MD Primary Care at [place].  05/04/18 10:53 PM

## 2018-05-05 LAB — HEPATITIS C ANTIBODY: Hep C Virus Ab: <0.1 s/co ratio (ref 0.0–0.9)

## 2018-05-07 ENCOUNTER — Ambulatory Visit: Payer: Self-pay | Admitting: *Deleted

## 2018-05-07 ENCOUNTER — Other Ambulatory Visit: Payer: Self-pay

## 2018-05-07 ENCOUNTER — Ambulatory Visit: Payer: BC Managed Care – PPO | Admitting: Family Medicine

## 2018-05-07 ENCOUNTER — Encounter: Payer: Self-pay | Admitting: Family Medicine

## 2018-05-07 VITALS — BP 108/68 | HR 96 | Temp 100.0°F | Resp 16 | Ht 71.26 in | Wt 152.0 lb

## 2018-05-07 DIAGNOSIS — R110 Nausea: Secondary | ICD-10-CM

## 2018-05-07 DIAGNOSIS — R1031 Right lower quadrant pain: Secondary | ICD-10-CM | POA: Diagnosis not present

## 2018-05-07 LAB — COMPREHENSIVE METABOLIC PANEL
ALT: 10 IU/L (ref 0–32)
AST: 11 IU/L (ref 0–40)
Albumin/Globulin Ratio: 1.6 (ref 1.2–2.2)
Albumin: 3.6 g/dL (ref 3.6–4.8)
Alkaline Phosphatase: 77 IU/L (ref 39–117)
BUN/Creatinine Ratio: 14 (ref 12–28)
BUN: 11 mg/dL (ref 8–27)
Bilirubin Total: 0.6 mg/dL (ref 0.0–1.2)
CO2: 22 mmol/L (ref 20–29)
Calcium: 8.8 mg/dL (ref 8.7–10.3)
Chloride: 103 mmol/L (ref 96–106)
Creatinine, Ser: 0.8 mg/dL (ref 0.57–1.00)
GFR calc Af Amer: 92 mL/min/{1.73_m2} (ref >59)
GFR calc non Af Amer: 80 mL/min/{1.73_m2} (ref >59)
Globulin, Total: 2.2 g/dL (ref 1.5–4.5)
Glucose: 115 mg/dL — ABNORMAL HIGH (ref 65–99)
Potassium: 3.9 mmol/L (ref 3.5–5.2)
Sodium: 139 mmol/L (ref 134–144)
Total Protein: 5.8 g/dL — ABNORMAL LOW (ref 6.0–8.5)

## 2018-05-07 LAB — POCT CBC
Granulocyte percent: 76 %G (ref 37–80)
HCT, POC: 39.2 % (ref 37.7–47.9)
Hemoglobin: 13.3 g/dL (ref 12.2–16.2)
Lymph, poc: 1.8 (ref 0.6–3.4)
MCH, POC: 30.6 pg (ref 27–31.2)
MCHC: 33.9 g/dL (ref 31.8–35.4)
MCV: 90.3 fL (ref 80–97)
MID (cbc): 1.1 — AB (ref 0–0.9)
MPV: 7.3 fL (ref 0–99.8)
POC Granulocyte: 9.3 — AB (ref 2–6.9)
POC LYMPH PERCENT: 14.7 %L (ref 10–50)
POC MID %: 9.3 %M (ref 0–12)
Platelet Count, POC: 228 10*3/uL (ref 142–424)
RBC: 4.34 M/uL (ref 4.04–5.48)
RDW, POC: 13.1 %
WBC: 12.2 10*3/uL — AB (ref 4.6–10.2)

## 2018-05-07 LAB — POCT URINALYSIS DIP (MANUAL ENTRY)
Bilirubin, UA: NEGATIVE
Glucose, UA: NEGATIVE mg/dL
Ketones, POC UA: NEGATIVE mg/dL
Leukocytes, UA: NEGATIVE
Nitrite, UA: NEGATIVE
Protein Ur, POC: NEGATIVE mg/dL
Spec Grav, UA: 1.02 (ref 1.010–1.025)
Urobilinogen, UA: 0.2 E.U./dL
pH, UA: 6 (ref 5.0–8.0)

## 2018-05-07 MED ORDER — ONDANSETRON 8 MG PO TBDP: 8.0000 mg | ORAL_TABLET | Freq: Three times a day (TID) | ORAL | 0 refills | Status: DC | PRN

## 2018-05-07 NOTE — Telephone Encounter (Addendum)
Pt called with complaints of abdominal pain with nausea which started last nigh/23/19; the patient says that initially it was in the middle of her stomach and sometimes it was on her lower right side;  she states that last night it was more of a sharp stomach pains and chills; this morning she feels like she has a fever, head aches, and ocassional stomach pain; recommendations made per triage protocol to include seeing a physician within 24 hours; she normally sees Dr Cindee Lame but he has no availability within the parameters set per protocol; pt offered and accepted appointment with Dr Reginia Forts, [place] [address], 05/07/18 at 1; she verbalizes understanding; will route to office for notification of this upcoming appoiintment.   Reason for Disposition . [1] MILD pain (e.g., does not interfere with normal activities) AND [2] pain comes and goes (cramps) AND [3] present > 48 hours  Answer Assessment - Initial Assessment Questions 1. LOCATION: "Where does it hurt?"      Lower right side and middle of abdomen 2. RADIATION: "Does the pain shoot anywhere else?" (e.g., chest, back)     no 3. ONSET: "When did the pain begin?" (e.g., minutes, hours or days ago)      05/06/18 at 1800 4. SUDDEN: "Gradual or sudden onset?"     gradually 5. PATTERN "Does the pain come and go, or is it constant?"    - If constant: "Is it getting better, staying the same, or worsening?"      (Note: Constant means the pain never goes away completely; most serious pain is constant and it progresses)     - If intermittent: "How long does it last?" "Do you have pain now?"     (Note: Intermittent means the pain goes away completely between bouts)     Intermittent; no pain now 6. SEVERITY: "How bad is the pain?"  (e.g., Scale 1-10; mild, moderate, or severe)   - MILD (1-3): doesn't interfere with normal activities, abdomen soft and not tender to touch    - MODERATE (4-7): interferes with normal activities or awakens from  sleep, tender to touch    - SEVERE (8-10): excruciating pain, doubled over, unable to do any normal activities      Not feeling pain now but moderate last night 7. RECURRENT SYMPTOM: "Have you ever had this type of abdominal pain before?" If so, ask: "When was the last time?" and "What happened that time?"      no 8. CAUSE: "What do you think is causing the abdominal pain?"     Not sure 9. RELIEVING/AGGRAVATING FACTORS: "What makes it better or worse?" (e.g., movement, antacids, bowel movement)     Not sure has not tried anything 10. OTHER SYMPTOMS: "Has there been any vomiting, diarrhea, constipation, or urine problems?"       Body aches, feels like she has a fever 11. PREGNANCY: "Is there any chance you are pregnant?" "When was your last menstrual period?"       no  Protocols used: ABDOMINAL PAIN - [facility]

## 2018-05-07 NOTE — Patient Instructions (Addendum)
     IF you received an x-ray today, you will receive an invoice from [facility]. Please contact [facility] at [phone] with questions or concerns regarding your invoice.   IF you received labwork today, you will receive an invoice from [facility]. Please contact [facility] at [phone] with questions or concerns regarding your invoice.   Our billing staff will not be able to assist you with questions regarding bills from these companies.  You will be contacted with the lab results as soon as they are available. The fastest way to get your results is to activate your My Chart account. Instructions are located on the last page of this paperwork. If you have not heard from [place] regarding the results in 2 weeks, please contact this office.      Abdominal Pain, Adult Abdominal pain can be caused by many things. Often, abdominal pain is not serious and it gets better with no treatment or by being treated at home. However, sometimes abdominal pain is serious. Your health care provider will do a medical history and a physical exam to try to determine the cause of your abdominal pain. Follow these instructions at home:  Take over-the-counter and prescription medicines only as told by your health care provider. Do not take a laxative unless told by your health care provider.  Drink enough fluid to keep your urine clear or pale yellow.  Watch your condition for any changes.  Keep all follow-up visits as told by your health care provider. This is important. Contact a health care provider if:  Your abdominal pain changes or gets worse.  You are not hungry or you lose weight without trying.  You are constipated or have diarrhea for more than 2-3 days.  You have pain when you urinate or have a bowel movement.  Your abdominal pain wakes you up at night.  Your pain gets worse with meals, after eating, or with certain foods.  You are throwing up and cannot keep anything  down.  You have a fever. Get help right away if:  Your pain does not go away as soon as your health care provider told you to expect.  You cannot stop throwing up.  Your pain is only in areas of the abdomen, such as the right side or the left lower portion of the abdomen.  You have bloody or black stools, or stools that look like tar.  You have severe pain, cramping, or bloating in your abdomen.  You have signs of dehydration, such as: ? Dark urine, very little urine, or no urine. ? Cracked lips. ? Dry mouth. ? Sunken eyes. ? Sleepiness. ? Weakness. This information is not intended to replace advice given to you by your health care provider. Make sure you discuss any questions you have with your health care provider. Document Released: 09/10/2005 Document Revised: 06/20/2016 Document Reviewed: 05/14/2016 Elsevier Interactive Patient Education  [employer].

## 2018-05-07 NOTE — Progress Notes (Signed)
Subjective:    Patient ID: Loney Laurence, female    DOB: 03-18-1956, 62 y.o.   MRN: [id number]  05/07/2018  Abdominal Pain (with some chills, pt states the pains was sharp ) and Fatigue    HPI This 62 y.o. female presents for abdominal pain, chills, fatigue.  Onset last night at 6:00pm.  Epigastric region; sharp shooting pains; no radiation. Kept waking up last night; had chills; felt badly; also had persistent epigastric pain last night; pain persisted until early this morning. Sharp pain with associated nausea.  Intermittent.  Occurred 10 times per hour. No diarrhea; last bowel movement this morning.  Developed sharp pain during bowel movement; no recurrent pain since bowel movement. No dysuria, frequency.  Nocturia increased last night yet up a lot.  No bloody stools.  No frequent heartburn, indigestion. No similar symptoms. No recent travel.  No recent abx use.   No sick contacts.  Daughter was sick recently with headache, feverish. No  history of surgeries.  Current pain 0/10; last night 5/10. No previous colonoscopy. No history of diverticulitis. School teacher; [employer]. Ate cereal for dinner last night.  Had pain before supper.    BP Readings from Last 3 Encounters:  05/07/18 108/68  05/04/18 110/68  11/27/17 108/66   Wt Readings from Last 3 Encounters:  05/07/18 152 lb (68.9 kg)  05/04/18 154 lb 12.8 oz (70.2 kg)  11/27/17 157 lb 6.4 oz (71.4 kg)   Immunization History  Administered Date(s) Administered  . Influenza,inj,Quad PF,6+ Mos 09/10/2015, 10/07/2017  . Influenza-Unspecified 09/14/2014  . Tdap 05/04/2018    Review of Systems  Constitutional: Positive for chills, diaphoresis, fatigue and fever.  Eyes: Negative for visual disturbance.  Respiratory: Negative for cough and shortness of breath.   Cardiovascular: Negative for chest pain, palpitations and leg swelling.  Gastrointestinal: Positive for abdominal distention, abdominal pain and nausea. Negative for  anal bleeding, blood in stool, constipation, diarrhea, rectal pain and vomiting.  Endocrine: Negative for cold intolerance, heat intolerance, polydipsia, polyphagia and polyuria.  Genitourinary: Negative for decreased urine volume, difficulty urinating, dyspareunia, dysuria, enuresis, flank pain, frequency, genital sores, hematuria, menstrual problem, pelvic pain, urgency, vaginal bleeding, vaginal discharge and vaginal pain.  Neurological: Negative for dizziness, tremors, seizures, syncope, facial asymmetry, speech difficulty, weakness, light-headedness, numbness and headaches.    Past Medical History:  Diagnosis Date  . Elevated lipids   . Foot pain, right   . Hypothyroid    History reviewed. No pertinent surgical history. No Known Allergies Current Outpatient Medications on File Prior to Visit  Medication Sig Dispense Refill  . gabapentin (NEURONTIN) 300 MG capsule Take 2 capsules (600 mg total) by mouth 4 (four) times daily. 720 capsule 3  . levothyroxine (SYNTHROID, LEVOTHROID) 50 MCG tablet TAKE 1 TABLET(50 MCG) BY MOUTH DAILY 90 tablet 2   No current facility-administered medications on file prior to visit.    Social History   Socioeconomic History  . Marital status: Single    Spouse name: Not on file  . Number of children: Not on file  . Years of education: Not on file  . Highest education level: Not on file  Occupational History  . Not on file  Social Needs  . Financial resource strain: Not on file  . Food insecurity:    Worry: Not on file    Inability: Not on file  . Transportation needs:    Medical: Not on file    Non-medical: Not on file  Tobacco Use  .  Smoking status: Never Smoker  . Smokeless tobacco: Never Used  Substance and Sexual Activity  . Alcohol use: No  . Drug use: No  . Sexual activity: Not on file  Lifestyle  . Physical activity:    Days per week: Not on file    Minutes per session: Not on file  . Stress: Not on file  Relationships  .  Social connections:    Talks on phone: Not on file    Gets together: Not on file    Attends religious service: Not on file    Active member of club or organization: Not on file    Attends meetings of clubs or organizations: Not on file    Relationship status: Not on file  . Intimate partner violence:    Fear of current or ex partner: Not on file    Emotionally abused: Not on file    Physically abused: Not on file    Forced sexual activity: Not on file  Other Topics Concern  . Not on file  Social History Narrative  . Not on file   Family History  Problem Relation Age of Onset  . Heart disease Father        Objective:    BP 108/68   Pulse 96   Temp 100 F (37.8 C) (Oral)   Resp 16   Ht 5' 11.26" (1.81 m)   Wt 152 lb (68.9 kg)   SpO2 98%   BMI 21.05 kg/m  Physical Exam  Constitutional: She is oriented to person, place, and time. She appears well-developed and well-nourished. No distress.  HENT:  Head: Normocephalic and atraumatic.  Right Ear: External ear normal.  Left Ear: External ear normal.  Nose: Nose normal.  Mouth/Throat: Oropharynx is clear and moist.  Eyes: Pupils are equal, round, and reactive to light. Conjunctivae and EOM are normal.  Neck: Normal range of motion. Neck supple. Carotid bruit is not present. No thyromegaly present.  Cardiovascular: Normal rate, regular rhythm, normal heart sounds and intact distal pulses. Exam reveals no gallop and no friction rub.  No murmur heard. Pulmonary/Chest: Effort normal and breath sounds normal. She has no wheezes. She has no rales.  Abdominal: Soft. Normal appearance and bowel sounds are normal. She exhibits no distension, no ascites, no pulsatile midline mass and no mass. There is no hepatosplenomegaly. There is no tenderness. There is no rigidity, no rebound, no guarding, no CVA tenderness, no tenderness at McBurney's point and negative Murphy's sign. No hernia.  Lymphadenopathy:    She has no cervical adenopathy.   Neurological: She is alert and oriented to person, place, and time. No cranial nerve deficit.  Skin: Skin is warm and dry. No rash noted. She is not diaphoretic. No erythema. No pallor.  Psychiatric: She has a normal mood and affect. Her behavior is normal.   No results found. Depression screen [facility] 2/9 05/07/2018 05/04/2018 11/27/2017 11/17/2017 06/08/2017  Decreased Interest 0 0 0 0 0  Down, Depressed, Hopeless 0 0 0 0 0  PHQ - 2 Score 0 0 0 0 0   Fall Risk  05/07/2018 05/04/2018 11/27/2017 11/17/2017 06/08/2017  Falls in the past year? No No No No No         Assessment & Plan:   1. Abdominal pain, RLQ   2. Nausea without vomiting     New onset lower abdominal pain associated with nausea in the past 24 hours with clinical improvement in the past 6 hours.  Benign abdominal exam.  Obtain labs including urinalysis with urine culture and CBC and conference of metabolic panel.  Currently pain-free; thus, recommend brat diet and hydration today.  If pain recurs, recommend undergoing CT of the abdomen and pelvis to evaluate for acute diverticulitis or other etiology.  Patient does have a low-grade fever in the office at 100 degrees; thus, infectious process is occurring.  Return to clinic for worsening or progression of symptoms.  Appendicitis unlikely as pain has resolved.    Orders Placed This Encounter  Procedures  . Urine Culture    Order Specific Question:   Source    Answer:   clean catch  . Comprehensive metabolic panel  . POCT CBC  . POCT urinalysis dipstick   Meds ordered this encounter  Medications  . ondansetron (ZOFRAN-ODT) 8 MG disintegrating tablet    Sig: Take 1 tablet (8 mg total) by mouth every 8 (eight) hours as needed for nausea.    Dispense:  20 tablet    Refill:  0    No follow-ups on file.   Gemma Ruan Elayne Guerin, M.D. Primary Care at [facility] [address] [place], [place]  [address] [phone]  phone [phone] fax

## 2018-05-08 LAB — URINE CULTURE

## 2018-05-31 ENCOUNTER — Other Ambulatory Visit: Payer: Self-pay | Admitting: Otolaryngology

## 2018-05-31 DIAGNOSIS — K219 Gastro-esophageal reflux disease without esophagitis: Secondary | ICD-10-CM

## 2018-06-04 ENCOUNTER — Ambulatory Visit
Admission: RE | Admit: 2018-06-04 | Discharge: 2018-06-04 | Disposition: A | Discharge status: Home / Self Care | Payer: BC Managed Care – PPO | Source: Ambulatory Visit | Attending: Otolaryngology | Admitting: Otolaryngology

## 2018-06-04 DIAGNOSIS — K219 Gastro-esophageal reflux disease without esophagitis: Secondary | ICD-10-CM

## 2018-06-12 ENCOUNTER — Encounter: Payer: BC Managed Care – PPO | Admitting: Family Medicine

## 2018-06-16 ENCOUNTER — Other Ambulatory Visit: Payer: Self-pay | Admitting: Family Medicine

## 2018-06-16 DIAGNOSIS — Z1231 Encounter for screening mammogram for malignant neoplasm of breast: Secondary | ICD-10-CM

## 2018-07-14 ENCOUNTER — Ambulatory Visit: Payer: BC Managed Care – PPO

## 2018-07-25 IMAGING — RF DG ESOPHAGUS
6 series · 15 of 21 positions shown · non-contrast
Comparison: None.

CLINICAL DATA: Laryngopharyngeal reflux

EXAM:
ESOPHOGRAM / BARIUM SWALLOW / BARIUM TABLET STUDY
TECHNIQUE: Combined double contrast and single contrast examination performed
using effervescent crystals, thick barium liquid, and thin barium
liquid. The patient was observed with fluoroscopy swallowing a 13 mm
barium sulphate tablet.
FLUOROSCOPY TIME:  Fluoroscopy Time:  1 minutes 12 second
Radiation Exposure Index (if provided by the fluoroscopic device):
Number of Acquired Spot Images: 0

[Series 1: sequence · 0.28mm/px · 3 of 11 frames shown (1 of 5)]
[frame 2/11]
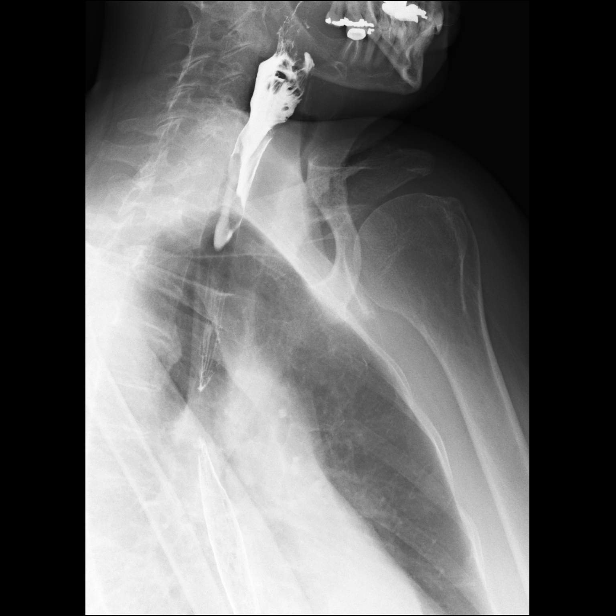
[frame 7/11]
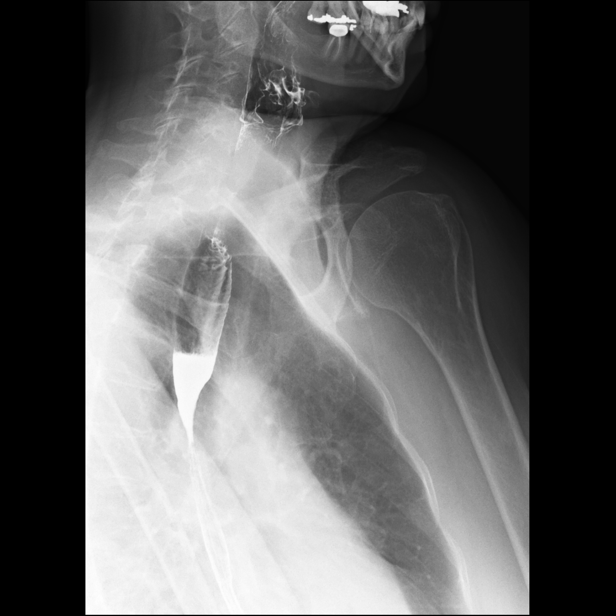
[frame 10/11]
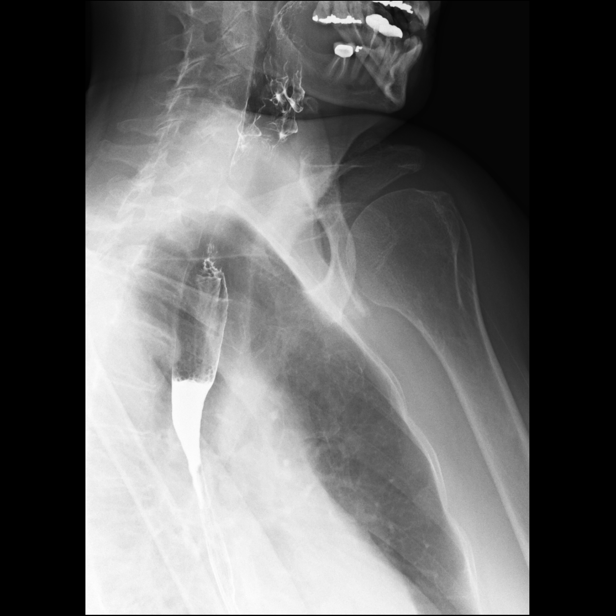

[Series 2: sequence · 0.28mm/px · 3 of 20 frames shown (2 of 5)]
[frame 4/20]
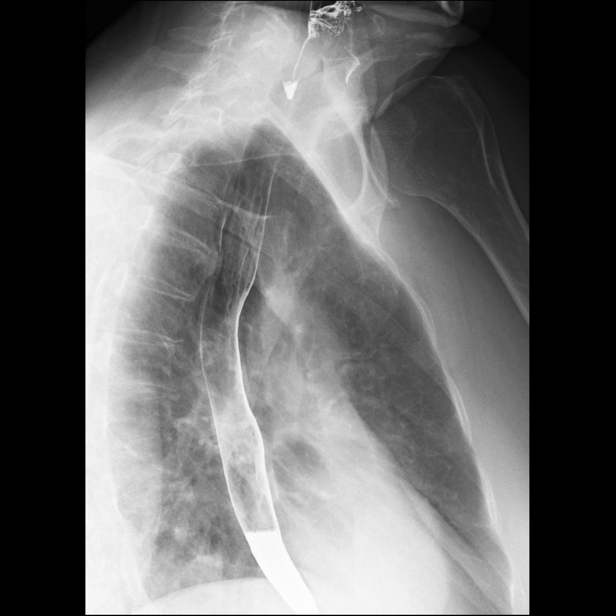
[frame 18/20]
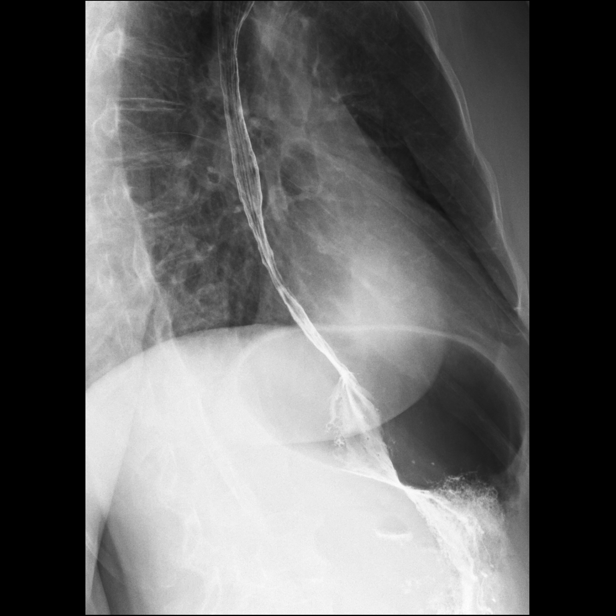
[frame 19/20]
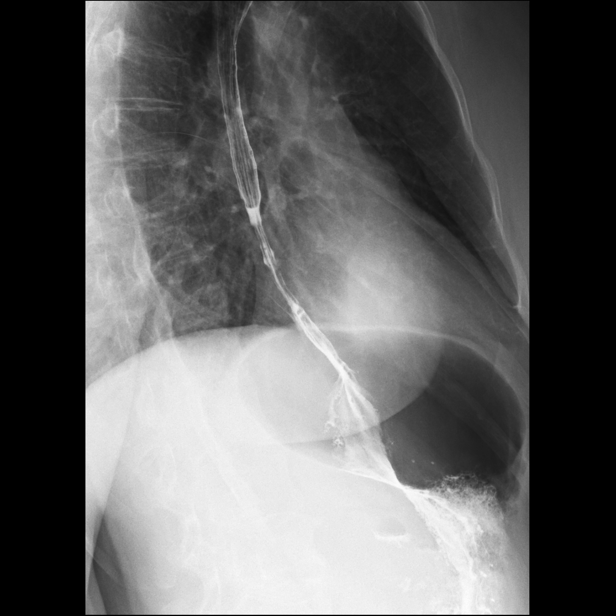

[Series 3: sequence · 0.28mm/px · 2 of 11 frames shown (3 of 5)]
[frame 6/11]
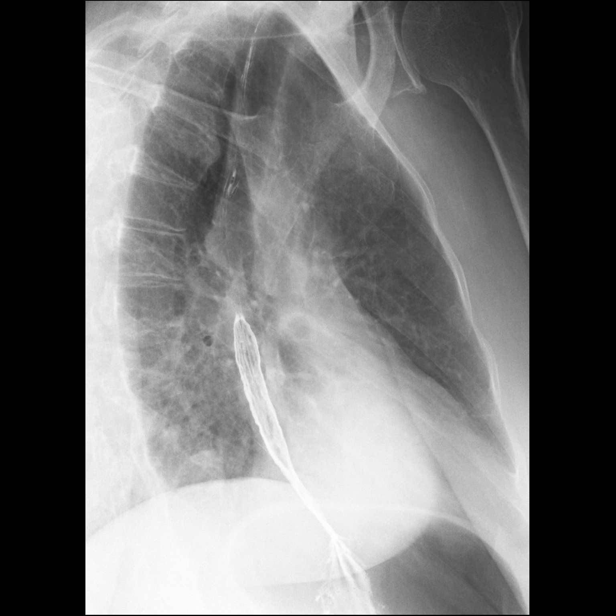
[frame 10/11]
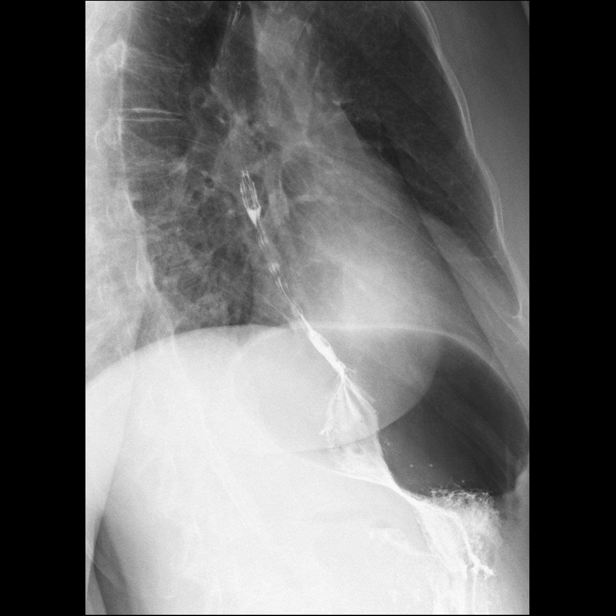

[Series 4: sequence · 0.28mm/px · 3 of 37 frames shown (4 of 5)]
[frame 6/37]
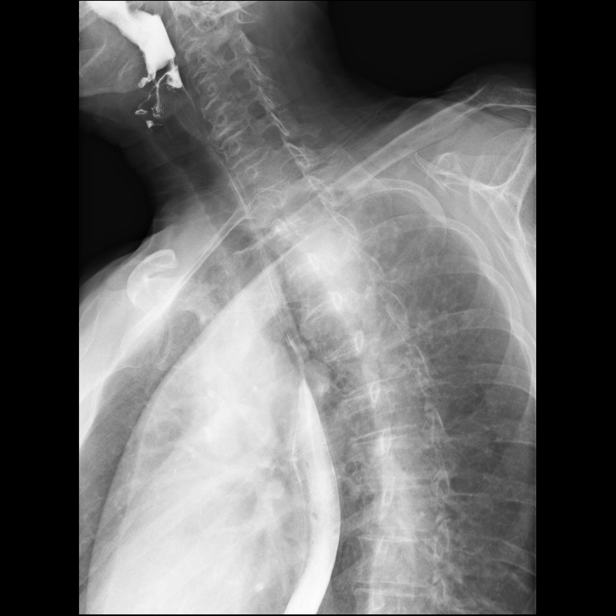
[frame 19/37]
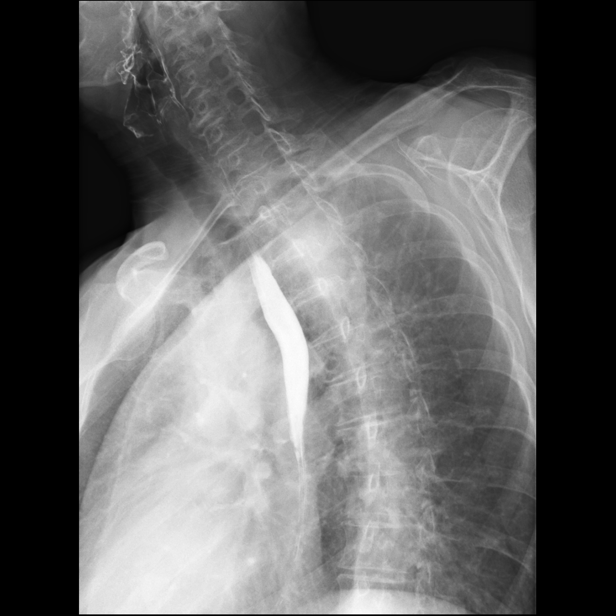
[frame 32/37]
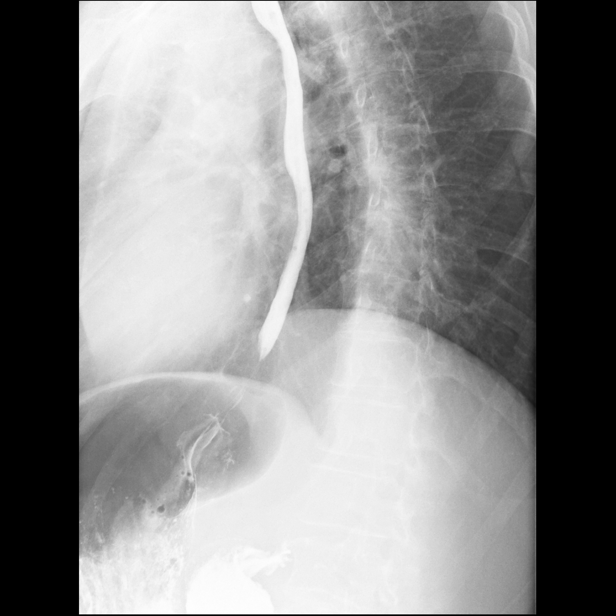

[Series 5: sequence · 0.28mm/px · 3 of 8 frames shown (5 of 5)]
[frame 5/8]
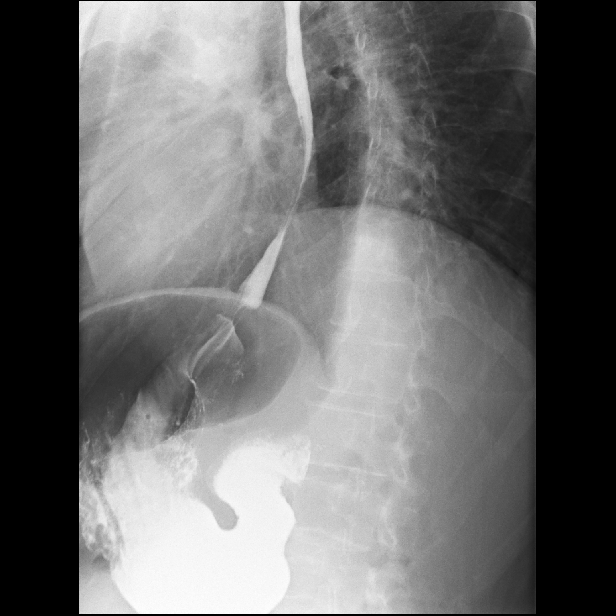
[frame 6/8]
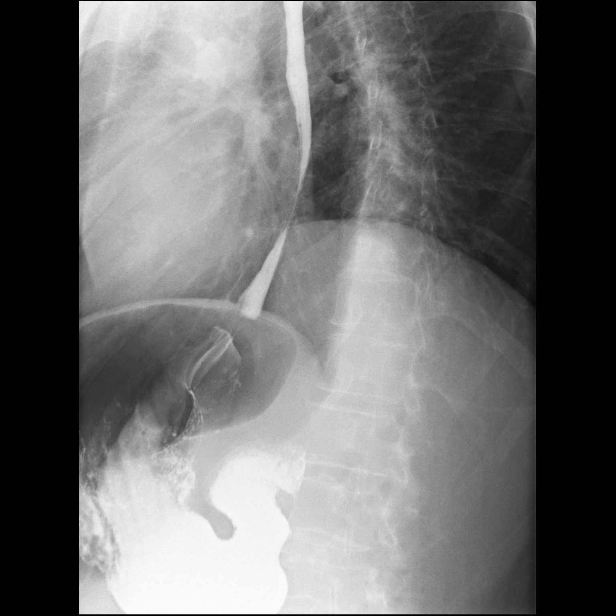
[frame 7/8]
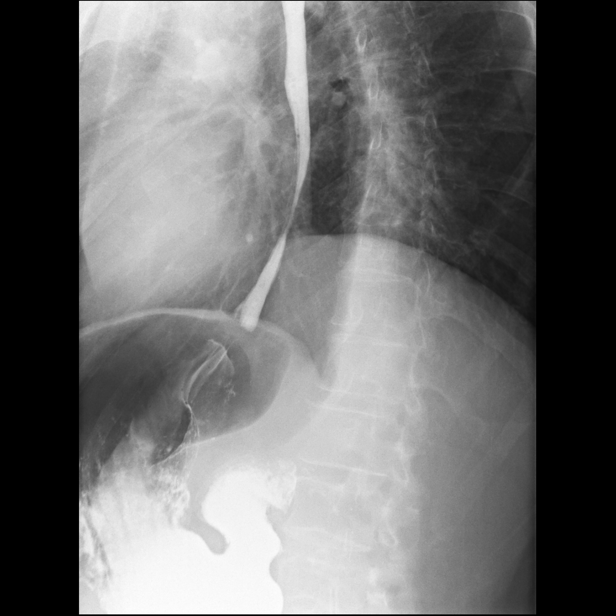

[Series 6: one shot · 1 of 2 slices shown]
[im 2/2]
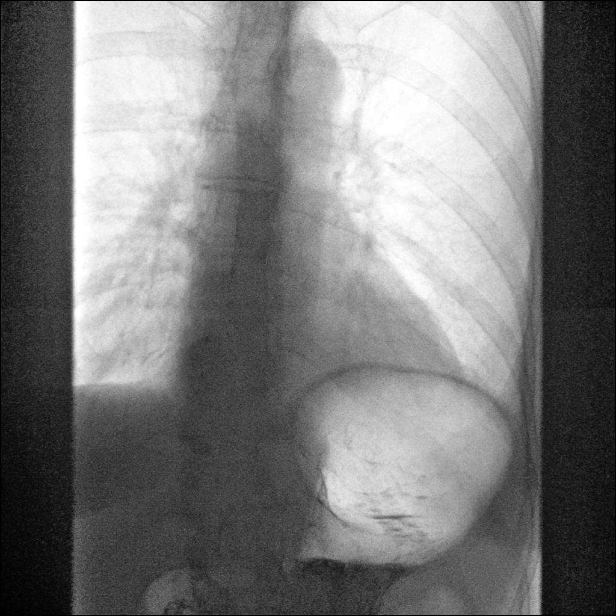

[15 of 21 positions shown; findings below may reference images not displayed]

FINDINGS: Esophageal mucosa and motility normal. Negative for stricture or
mass. Negative for hiatal hernia or reflux

Barium tablet passed readily into the stomach without delay
IMPRESSION: Negative

## 2018-12-30 ENCOUNTER — Other Ambulatory Visit: Payer: Self-pay | Admitting: Family Medicine

## 2018-12-30 DIAGNOSIS — Z1231 Encounter for screening mammogram for malignant neoplasm of breast: Secondary | ICD-10-CM

## 2019-01-26 ENCOUNTER — Ambulatory Visit
Admission: RE | Admit: 2019-01-26 | Discharge: 2019-01-26 | Disposition: A | Discharge status: Home / Self Care | Payer: BC Managed Care – PPO | Source: Ambulatory Visit | Attending: Family Medicine | Admitting: Family Medicine

## 2019-01-26 DIAGNOSIS — Z1231 Encounter for screening mammogram for malignant neoplasm of breast: Secondary | ICD-10-CM

## 2019-02-20 ENCOUNTER — Other Ambulatory Visit: Payer: Self-pay | Admitting: Family Medicine

## 2019-02-20 DIAGNOSIS — E039 Hypothyroidism, unspecified: Secondary | ICD-10-CM

## 2019-02-21 NOTE — Telephone Encounter (Signed)
Requested Prescriptions  Pending Prescriptions Disp Refills  . levothyroxine (SYNTHROID, LEVOTHROID) 50 MCG tablet [Pharmacy Med Name: LEVOTHYROXINE 0.05MG  (50MCG) TAB] 90 tablet 0    Sig: TAKE 1 TABLET(50 MCG) BY MOUTH DAILY     Endocrinology:  Hypothyroid Agents Failed - 02/20/2019  1:57 PM      Failed - TSH needs to be rechecked within 3 months after an abnormal result. Refill until TSH is due.      Failed - TSH in normal range and within 360 days    TSH  Date Value Ref Range Status  06/08/2017 2.140 0.450 - 4.500 uIU/mL Final         Passed - Valid encounter within last 12 months    Recent Outpatient Visits          9 months ago Abdominal pain, RLQ   [facility] at [facility], Renette Butters, MD   9 months ago Hypothyroidism, unspecified type   [facility], Ranell Patrick, MD   1 year ago Flank pain   [facility] at [facility], Lilia Argue, MD   1 year ago Globus sensation   [facility], Ranell Patrick, MD   1 year ago PND (post-nasal drip)   [facility] at [facility], Ranell Patrick, MD      Future Appointments            In 2 weeks Carlota Raspberry Ranell Patrick, MD [facility] at [place], [facility]

## 2019-03-07 ENCOUNTER — Ambulatory Visit: Payer: BC Managed Care – PPO | Admitting: Family Medicine

## 2019-05-24 ENCOUNTER — Other Ambulatory Visit: Payer: Self-pay | Admitting: Family Medicine

## 2019-05-24 DIAGNOSIS — E039 Hypothyroidism, unspecified: Secondary | ICD-10-CM

## 2019-05-25 ENCOUNTER — Encounter: Payer: Self-pay | Admitting: Family Medicine

## 2019-05-25 ENCOUNTER — Other Ambulatory Visit: Payer: Self-pay

## 2019-05-25 ENCOUNTER — Ambulatory Visit: Payer: BC Managed Care – PPO | Admitting: Family Medicine

## 2019-05-25 VITALS — BP 128/74 | HR 57 | Temp 98.1°F | Resp 12 | Wt 157.8 lb

## 2019-05-25 DIAGNOSIS — R0981 Nasal congestion: Secondary | ICD-10-CM

## 2019-05-25 DIAGNOSIS — E785 Hyperlipidemia, unspecified: Secondary | ICD-10-CM

## 2019-05-25 DIAGNOSIS — R0989 Other specified symptoms and signs involving the circulatory and respiratory systems: Secondary | ICD-10-CM

## 2019-05-25 DIAGNOSIS — E039 Hypothyroidism, unspecified: Secondary | ICD-10-CM

## 2019-05-25 DIAGNOSIS — M79671 Pain in right foot: Secondary | ICD-10-CM

## 2019-05-25 DIAGNOSIS — R739 Hyperglycemia, unspecified: Secondary | ICD-10-CM | POA: Diagnosis not present

## 2019-05-25 DIAGNOSIS — R198 Other specified symptoms and signs involving the digestive system and abdomen: Secondary | ICD-10-CM

## 2019-05-25 MED ORDER — LEVOTHYROXINE SODIUM 50 MCG PO TABS: ORAL_TABLET | ORAL | 2 refills | Status: DC

## 2019-05-25 MED ORDER — GABAPENTIN 300 MG PO CAPS: 600.0000 mg | ORAL_CAPSULE | Freq: Four times a day (QID) | ORAL | 3 refills | Status: DC

## 2019-05-25 MED ORDER — FLUTICASONE PROPIONATE 50 MCG/ACT NA SUSP: 1.0000 | Freq: Every day | NASAL | 6 refills | Status: DC

## 2019-05-25 MED ORDER — OMEPRAZOLE 20 MG PO CPDR: 20.0000 mg | DELAYED_RELEASE_CAPSULE | Freq: Two times a day (BID) | ORAL | 1 refills | Status: DC

## 2019-05-25 NOTE — Progress Notes (Signed)
Subjective:    Patient ID: Sylvia Pruitt, female    DOB: 07-19-1956, 63 y.o.   MRN: [id number]  HPI Sylvia Pruitt is a 63 y.o. female Presents today for: Chief Complaint  Patient presents with  . medication review    Patient was last seen on 05/04/18 here today for medication review. Patient need a refill on gabapentin and synthroid  . Mucus    Patient stated last time she was here she was referred to ENT DR for mucus in bacfk of throat. They never got to the bottom of the throat mucus. Not sure of the next step   Hypothyroidism: Lab Results  Component Value Date   TSH 2.140 06/08/2017  Currently on Synthroid 50 mcg daily Wondered if related to mucus in throat.  Feels like more hair has fallen out over the years.  No heat or cold intolerance.  No weight changes.  No heart palpitations.  Hx of thyroiditis, so knows those symptoms - not having.   Globus sensation: See previous notes, was referred to ear nose and throat, Dr. Janace Hoard in January 2018.  Recommended increasing Protonix, suggested barium swallow with pH probe and thyroid ultrasound to rule out nodules which were deferred.  Still reported consistent globus sensation when discussed in May 2019.  Had not followed up with ENT at that time. Office visit in June 2019 with Dr. Janace Hoard.  Barium swallow did not show any evidence of mass lesion or findings.  Did not have symptoms of dysphasia, odynophagia, soreness, hoarseness or ear pain.  Recommended twice a day treatment for 2 months of PPI and then once per day for an additional month for possible laryngeal pharyngeal reflux, then possible gastroenterology eval if persistent.  Still feels mucus in back of throat - feels like getting worse - there all day long, and notes in morning. No heartburn, voice change/hoarseness or odynophagia.  Blows nose - clear d/c few times with watery nasal d/c.  No recent use of steroid nasal spray.  Worse after eating, but no heartburn - no specific  food triggers.  Not sure if took BID PPI for 2 months, then QD.  No recent PPI.  No recent GI eval.   I did order thyroid US last year - not done - did not feel liek needed after meeting with ENT.   Tx: cough drops with menthol - helps symptoms.  No wt loss, night sweats, fever, chills.    Hyperlipidemia:  Lab Results  Component Value Date   CHOL 232 (H) 06/08/2017   HDL 61 06/08/2017   LDLCALC 157 (H) 06/08/2017   TRIG 70 06/08/2017   CHOLHDL 3.8 06/08/2017   Lab Results  Component Value Date   ALT 10 05/07/2018   AST 11 05/07/2018   ALKPHOS 77 05/07/2018   BILITOT 0.6 05/07/2018  Not fasting currently. Has not had recent testing - no current meds.   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  Treated with gabapentin 600 mg 4 times daily.  No new side effects. Managing pain in feet. No attempted dose changes.    Patient Active Problem List   Diagnosis Date Noted  . 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 Hypothyroid 03/29/2013  . 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 Other and unspecified hyperlipidemia 03/29/2013   Past Medical History:  Diagnosis Date  . Elevated lipids   . Foot pain, right   . Hypothyroid    No past surgical history on file.  No Known Allergies Prior to Admission medications   Medication Sig Start Date End Date Taking? Authorizing Provider  gabapentin (NEURONTIN) 300 MG capsule Take 2 capsules (600 mg total) by mouth 4 (four) times daily. 05/04/18  Yes Wendie Agreste, MD  levothyroxine (SYNTHROID, LEVOTHROID) 50 MCG tablet TAKE 1 TABLET(50 MCG) BY MOUTH DAILY 02/21/19  Yes Wendie Agreste, MD   Social History   Socioeconomic History  . Marital status: Single    Spouse name: Not on file  . Number of children: Not on file  . Years of education: Not on file  . Highest education level: Not on file  Occupational History  . Not on file  Social Needs  . Financial resource strain: Not on file  . Food insecurity:     Worry: Not on file    Inability: Not on file  . Transportation needs:    Medical: Not on file    Non-medical: Not on file  Tobacco Use  . Smoking status: Never Smoker  . Smokeless tobacco: Never Used  Substance and Sexual Activity  . Alcohol use: No  . Drug use: No  . Sexual activity: Not on file  Lifestyle  . Physical activity:    Days per week: Not on file    Minutes per session: Not on file  . Stress: Not on file  Relationships  . Social connections:    Talks on phone: Not on file    Gets together: Not on file    Attends religious service: Not on file    Active member of club or organization: Not on file    Attends meetings of clubs or organizations: Not on file    Relationship status: Not on file  . Intimate partner violence:    Fear of current or ex partner: Not on file    Emotionally abused: Not on file    Physically abused: Not on file    Forced sexual activity: Not on file  Other Topics Concern  . Not on file  Social History Narrative  . Not on file    Review of Systems Per HPI.     Objective:   Physical Exam Vitals signs reviewed.  Constitutional:      Appearance: She is well-developed.  HENT:     Head: Normocephalic and atraumatic.  Eyes:     Conjunctiva/sclera: Conjunctivae normal.     Pupils: Pupils are equal, round, and reactive to light.  Neck:     Musculoskeletal: No neck rigidity or muscular tenderness.     Vascular: No carotid bruit.     Trachea: Trachea and phonation normal.     Comments: No stridor Cardiovascular:     Rate and Rhythm: Normal rate and regular rhythm.     Heart sounds: Normal heart sounds.  Pulmonary:     Effort: Pulmonary effort is normal.     Breath sounds: Normal breath sounds.  Abdominal:     Palpations: Abdomen is soft. There is no pulsatile mass.     Tenderness: There is no abdominal tenderness.  Lymphadenopathy:     Cervical: No cervical adenopathy.  Skin:    General: Skin is warm and dry.  Neurological:      Mental Status: She is alert and oriented to person, place, and time.  Psychiatric:        Behavior: Behavior normal.    Vitals:   05/25/19 1642  BP: 128/74  Pulse: (!) 57  Resp: 12  Temp: 98.1 F (36.7 C)  TempSrc: Oral  SpO2: 98%  Weight: 157 lb 12.8 oz (71.6 kg)       Assessment & Plan:   Sylvia Pruitt is a 63 y.o. female Globus sensation - Plan: Ambulatory referral to Gastroenterology, fluticasone (FLONASE) 50 MCG/ACT nasal spray, omeprazole (PRILOSEC) 20 MG capsule Nasal congestion - Plan: fluticasone (FLONASE) 50 MCG/ACT nasal spray  -Previously discussed and has had evaluation, subsequent treatment by ear nose and throat.  Thought at that time was possible laryngeal pharyngeal reflux as a contributor.  Now also with some intermittent nasal congestion, so postnasal drip may also contribute.  -Refer to gastroenterology to decide on endoscopy versus pH testing.   -Recommended restarting PPI with omeprazole twice daily for now and restart Flonase  -Did recommend decreasing lozenges as those may be actually contributing to symptoms depending on ingredients  -Follow-up in 6 weeks to determine course at that time.  Sooner if worse  Right foot pain - Plan: gabapentin (NEURONTIN) 300 MG capsule  -Stable, continue same doses of gabapentin.  Hypothyroidism, unspecified type - Plan: levothyroxine (SYNTHROID) 50 MCG tablet, TSH + free T4  -Does report some hair falling out diffusely over the past few years, last TSH was stable.  Repeat TSH with free T4, continue same dose Synthroid for now, then potentially may need dermatology eval or other discussions of hair loss besides thyroid  Hyperglycemia - Plan: Hemoglobin A1c Hyperlipidemia, unspecified hyperlipidemia type - Plan: Comprehensive metabolic panel, Lipid panel  -Prior hyperlipidemia, hyperglycemia, not on treatment.  Not fasting at present, return for TSH as above as well as other fasting lab work in the next week.  Meds  ordered this encounter  Medications  . gabapentin (NEURONTIN) 300 MG capsule    Sig: Take 2 capsules (600 mg total) by mouth 4 (four) times daily.    Dispense:  720 capsule    Refill:  3  . levothyroxine (SYNTHROID) 50 MCG tablet    Sig: 2    Dispense:  90 tablet    Refill:  2  . fluticasone (FLONASE) 50 MCG/ACT nasal spray    Sig: Place 1-2 sprays into both nostrils daily.    Dispense:  16 g    Refill:  6  . omeprazole (PRILOSEC) 20 MG capsule    Sig: Take 1 capsule (20 mg total) by mouth 2 (two) times daily before a meal.    Dispense:  60 capsule    Refill:  1   Patient Instructions   I would recommend restarting flonase nasal spray one spray in each nostril now that you do have some nasal congestion symptoms.  Try to decrease use of cough drops as sometimes those can make the sensation worse.  I would recommend restarting omeprazole twice per day as discussed by Dr. Janace Hoard, and I will refer you to gastroenterology to discuss other possible testing.   No change in thyroid medication dose at this time, but will check some testing with the upcoming fasting blood work.  I am also going to check the cholesterol as it was elevated in the past and a blood sugar/diabetes screening test as it was a few points elevated last year.  Please follow-up in 6 weeks.  Return to the clinic or go to the nearest emergency room if any of your symptoms worsen or new symptoms occur.  Thanks for coming in today.    If you have lab work done today you will be contacted with your lab results within the next 2 weeks.  If  you have not heard from [place] then please contact us. The fastest way to get your results is to register for My Chart.   IF you received an x-ray today, you will receive an invoice from [facility]. Please contact [facility] at [phone] with questions or concerns regarding your invoice.   IF you received labwork today, you will receive an invoice from [place] [facility].  Please contact [facility] at [phone] with questions or concerns regarding your invoice.   Our billing staff will not be able to assist you with questions regarding bills from these companies.  You will be contacted with the lab results as soon as they are available. The fastest way to get your results is to activate your My Chart account. Instructions are located on the last page of this paperwork. If you have not heard from [place] regarding the results in 2 weeks, please contact this office.      Signed,   [name], MD [facility].  05/26/19 2:53 PM

## 2019-05-25 NOTE — Patient Instructions (Addendum)
I would recommend restarting flonase nasal spray one spray in each nostril now that you do have some nasal congestion symptoms.  Try to decrease use of cough drops as sometimes those can make the sensation worse.  I would recommend restarting omeprazole twice per day as discussed by Dr. Janace Hoard, and I will refer you to gastroenterology to discuss other possible testing.   No change in thyroid medication dose at this time, but will check some testing with the upcoming fasting blood work.  I am also going to check the cholesterol as it was elevated in the past and a blood sugar/diabetes screening test as it was a few points elevated last year.  Please follow-up in 6 weeks.  Return to the clinic or go to the nearest emergency room if any of your symptoms worsen or new symptoms occur.  Thanks for coming in today.    If you have lab work done today you will be contacted with your lab results within the next 2 weeks.  If you have not heard from [place] then please contact us. The fastest way to get your results is to register for My Chart.   IF you received an x-ray today, you will receive an invoice from [facility]. Please contact [facility] at [phone] with questions or concerns regarding your invoice.   IF you received labwork today, you will receive an invoice from [place]. Please contact [facility] at [phone] with questions or concerns regarding your invoice.   Our billing staff will not be able to assist you with questions regarding bills from these companies.  You will be contacted with the lab results as soon as they are available. The fastest way to get your results is to activate your My Chart account. Instructions are located on the last page of this paperwork. If you have not heard from [place] regarding the results in 2 weeks, please contact this office.

## 2019-05-26 ENCOUNTER — Ambulatory Visit (INDEPENDENT_AMBULATORY_CARE_PROVIDER_SITE_OTHER): Payer: BC Managed Care – PPO | Admitting: Emergency Medicine

## 2019-05-26 DIAGNOSIS — E039 Hypothyroidism, unspecified: Secondary | ICD-10-CM

## 2019-05-26 DIAGNOSIS — R739 Hyperglycemia, unspecified: Secondary | ICD-10-CM

## 2019-05-26 DIAGNOSIS — E785 Hyperlipidemia, unspecified: Secondary | ICD-10-CM

## 2019-05-26 LAB — LIPID PANEL

## 2019-05-27 LAB — LIPID PANEL
Chol/HDL Ratio: 4 ratio (ref 0.0–4.4)
Cholesterol, Total: 218 mg/dL — ABNORMAL HIGH (ref 100–199)
HDL: 55 mg/dL (ref >39)
LDL Calculated: 144 mg/dL — ABNORMAL HIGH (ref 0–99)
Triglycerides: 97 mg/dL (ref 0–149)
VLDL Cholesterol Cal: 19 mg/dL (ref 5–40)

## 2019-05-27 LAB — COMPREHENSIVE METABOLIC PANEL
ALT: 8 IU/L (ref 0–32)
AST: 16 IU/L (ref 0–40)
Albumin/Globulin Ratio: 1.9 (ref 1.2–2.2)
Albumin: 3.9 g/dL (ref 3.8–4.8)
Alkaline Phosphatase: 83 IU/L (ref 39–117)
BUN/Creatinine Ratio: 14 (ref 12–28)
BUN: 12 mg/dL (ref 8–27)
Bilirubin Total: 0.5 mg/dL (ref 0.0–1.2)
CO2: 23 mmol/L (ref 20–29)
Calcium: 8.9 mg/dL (ref 8.7–10.3)
Chloride: 105 mmol/L (ref 96–106)
Creatinine, Ser: 0.84 mg/dL (ref 0.57–1.00)
GFR calc Af Amer: 86 mL/min/{1.73_m2} (ref >59)
GFR calc non Af Amer: 75 mL/min/{1.73_m2} (ref >59)
Globulin, Total: 2.1 g/dL (ref 1.5–4.5)
Glucose: 90 mg/dL (ref 65–99)
Potassium: 3.9 mmol/L (ref 3.5–5.2)
Sodium: 141 mmol/L (ref 134–144)
Total Protein: 6 g/dL (ref 6.0–8.5)

## 2019-05-27 LAB — TSH+FREE T4
Free T4: 1.4 ng/dL (ref 0.82–1.77)
TSH: 2.07 u[IU]/mL (ref 0.450–4.500)

## 2019-05-27 LAB — HEMOGLOBIN A1C
Est. average glucose Bld gHb Est-mCnc: 114 mg/dL
Hgb A1c MFr Bld: 5.6 % (ref 4.8–5.6)

## 2019-07-05 ENCOUNTER — Ambulatory Visit: Payer: BC Managed Care – PPO | Admitting: Family Medicine

## 2019-07-07 ENCOUNTER — Ambulatory Visit: Payer: BC Managed Care – PPO | Admitting: Family Medicine

## 2019-09-05 ENCOUNTER — Other Ambulatory Visit: Payer: Self-pay

## 2019-09-05 ENCOUNTER — Ambulatory Visit: Payer: BC Managed Care – PPO | Admitting: Family Medicine

## 2019-09-05 ENCOUNTER — Encounter: Payer: Self-pay | Admitting: Family Medicine

## 2019-09-05 VITALS — BP 107/71 | HR 64 | Temp 99.0°F | Wt 146.0 lb

## 2019-09-05 DIAGNOSIS — Z23 Encounter for immunization: Secondary | ICD-10-CM

## 2019-09-05 DIAGNOSIS — D171 Benign lipomatous neoplasm of skin and subcutaneous tissue of trunk: Secondary | ICD-10-CM

## 2019-09-05 NOTE — Patient Instructions (Addendum)
° ° ° °  If you have lab work done today you will be contacted with your lab results within the next 2 weeks.  If you have not heard from us then please contact us. The fastest way to get your results is to register for My Chart. ° ° °IF you received an x-ray today, you will receive an invoice from [facility]. Please contact [facility] at [phone] with questions or concerns regarding your invoice.  ° °IF you received labwork today, you will receive an invoice from [facility]. Please contact [facility] at [phone] with questions or concerns regarding your invoice.  ° °Our billing staff will not be able to assist you with questions regarding bills from these companies. ° °You will be contacted with the lab results as soon as they are available. The fastest way to get your results is to activate your My Chart account. Instructions are located on the last page of this paperwork. If you have not heard from us regarding the results in 2 weeks, please contact this office. °  ° ° ° °

## 2019-09-05 NOTE — Progress Notes (Signed)
Subjective:    Patient ID: Sylvia Pruitt, female    DOB: [birth year], 63 y.o.   MRN: [id number]  Sylvia Pruitt is a 63 y.o. female Presents today for: Chief Complaint  Patient presents with  . Mass    left rib area been there for years now just want ot have it looked at.   Lump on left rib.  Told had lipoma there in past.  More prominent since 10# weight loss.  Not usually sore, interferes with bra straps.  No redness., no skin changes.  No chest pain/dyspnea.  Considering having this removed.    Patient Active Problem List   Diagnosis Date Noted  . 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 Hypothyroid 03/29/2013  . 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 Other and unspecified hyperlipidemia 03/29/2013   Past Medical History:  Diagnosis Date  . Elevated lipids   . Foot pain, right   . Hypothyroid    No past surgical history on file. No Known Allergies Prior to Admission medications   Medication Sig Start Date End Date Taking? Authorizing Provider  gabapentin (NEURONTIN) 300 MG capsule Take 2 capsules (600 mg total) by mouth 4 (four) times daily. 05/25/19  Yes Wendie Agreste, MD  levothyroxine (SYNTHROID) 50 MCG tablet 2 05/25/19  Yes Wendie Agreste, MD   Social History   Socioeconomic History  . Marital status: Single    Spouse name: Not on file  . Number of children: Not on file  . Years of education: Not on file  . Highest education level: Not on file  Occupational History  . Not on file  Social Needs  . Financial resource strain: Not on file  . Food insecurity    Worry: Not on file    Inability: Not on file  . Transportation needs    Medical: Not on file    Non-medical: Not on file  Tobacco Use  . Smoking status: Never Smoker  . Smokeless tobacco: Never Used  Substance and Sexual Activity  . Alcohol use: No  . Drug use: No  . Sexual activity: Not on file  Lifestyle  . Physical activity    Days per week: Not on file    Minutes per session: Not on file  .  Stress: Not on file  Relationships  . Social Herbalist on phone: Not on file    Gets together: Not on file    Attends religious service: Not on file    Active member of club or organization: Not on file    Attends meetings of clubs or organizations: Not on file    Relationship status: Not on file  . Intimate partner violence    Fear of current or ex partner: Not on file    Emotionally abused: Not on file    Physically abused: Not on file    Forced sexual activity: Not on file  Other Topics Concern  . Not on file  Social History Narrative  . Not on file    Review of Systems [name]     Objective:   Physical Exam Constitutional:      General: She is not in acute distress.    Appearance: She is well-developed.  HENT:     Head: Normocephalic and atraumatic.  Cardiovascular:     Rate and Rhythm: Normal rate.  Pulmonary:     Effort: Pulmonary effort is normal.  Chest:    Neurological:     Mental  Status: She is alert and oriented to person, place, and time.    Vitals:   09/05/19 1649  BP: 107/71  Pulse: 64  Temp: 99 F (37.2 C)  TempSrc: Oral  SpO2: 97%  Weight: 146 lb (66.2 kg)       Assessment & Plan:  Sylvia Pruitt is a 63 y.o. female Lipoma of chest wall - Plan: Ambulatory referral to General Surgery  -Similar size when compared to notes from 2014.  Irritated in the area due to bra strap/clothing.  Would like to evaluate for excision, refer to general surgery.  Need for prophylactic vaccination and inoculation against influenza - Plan: Flu Vaccine QUAD 6+ mos PF IM (Fluarix Quad PF)   No orders of the defined types were placed in this encounter.  Patient Instructions     If you have lab work done today you will be contacted with your lab results within the next 2 weeks.  If you have not heard from [place] then please contact us. The fastest way to get your results is to register for My Chart.   IF you received an x-ray today, you will receive  an invoice from [facility]. Please contact [facility] at [phone] with questions or concerns regarding your invoice.   IF you received labwork today, you will receive an invoice from [place]. Please contact [facility] at [phone] with questions or concerns regarding your invoice.   Our billing staff will not be able to assist you with questions regarding bills from these companies.  You will be contacted with the lab results as soon as they are available. The fastest way to get your results is to activate your My Chart account. Instructions are located on the last page of this paperwork. If you have not heard from [place] regarding the results in 2 weeks, please contact this office.       Signed,   [name], MD [facility].  09/06/19 11:04 AM

## 2019-09-06 ENCOUNTER — Encounter: Payer: Self-pay | Admitting: Family Medicine

## 2019-10-10 ENCOUNTER — Ambulatory Visit (INDEPENDENT_AMBULATORY_CARE_PROVIDER_SITE_OTHER): Payer: BC Managed Care – PPO | Admitting: Family Medicine

## 2019-10-10 ENCOUNTER — Encounter: Payer: Self-pay | Admitting: Family Medicine

## 2019-10-10 ENCOUNTER — Other Ambulatory Visit: Payer: Self-pay

## 2019-10-10 VITALS — BP 111/69 | HR 58 | Temp 98.0°F | Wt 144.4 lb

## 2019-10-10 DIAGNOSIS — R0989 Other specified symptoms and signs involving the circulatory and respiratory systems: Secondary | ICD-10-CM | POA: Diagnosis not present

## 2019-10-10 DIAGNOSIS — R198 Other specified symptoms and signs involving the digestive system and abdomen: Secondary | ICD-10-CM

## 2019-10-10 MED ORDER — OMEPRAZOLE 20 MG PO CPDR: 20.0000 mg | DELAYED_RELEASE_CAPSULE | Freq: Two times a day (BID) | ORAL | 1 refills | Status: AC

## 2019-10-10 NOTE — Progress Notes (Signed)
Subjective:    Patient ID: Sylvia Pruitt, female    DOB: January 06, 1956, 63 y.o.   MRN: [id number]  HPI Sylvia Pruitt is a 63 y.o. female Presents today for: Chief Complaint  Patient presents with   mucus    was last seen on 09/05/19 [place] mucus in throat. Still have mucus the drains in the bcak of throat. Also need a note to state I can workout at the gym   Globus sensation: Last discussed in June.  Previously discussed and had evaluation and subsequent treatment by ear nose and throat, Dr. Janace Hoard.  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  Recommended continued Flonase and omeprazole.  She was also referred to gastroenterology to decide on endoscopy versus pH testing.  Also recommended decreasing lozenges.   Evaluated by Dr. Kathryne Eriksson on 7/24 for excessive oral secretions and second opinion.  Treated with Atrovent nasal spray, Elavil 10 mg 1-2 at bedtime to reduce secretions.   In am, not as bad. Notes symptoms within minutes of getting up. Notes with any eating - even water. Not sure if worse with lying down.  No change with fluticasone ns.  Breathing in air through nose notices more - builds up in back of throat.  No concerns on prior laryngoscopy.  Off omeprazole for awhile, no known change in symptoms with stopping PPI.  Unable to follow up with GI d/t scheduling issues.  no change with use of atrovent NS for weeks - stopped that med.  Amitriptyline caused dry mouth even at 10mg  dose. Felt tired/drugged on that med - only took for a few days but no change in feeling of mucus in back of throat.  Cinnamon Gum and menthol or other lozenges are only thing that helps.   Tried sudafed without relief.   Wants note for gym to use weights.  Plans on wearing mask.   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 No Known Allergies Prior to Admission medications   Medication Sig Start Date End Date Taking? Authorizing Provider  gabapentin (NEURONTIN) 300 MG capsule Take 2 capsules (600 mg total) by mouth 4 (four) times daily. 05/25/19  Yes Wendie Agreste, MD  levothyroxine (SYNTHROID) 50 MCG tablet 2 05/25/19  Yes Wendie Agreste, MD   Social History   Socioeconomic History   Marital status: Single    Spouse name: Not on file   Number of children: Not on file   Years of education: Not on file   Highest education level: Not on file  Occupational History   Not on file  Social Needs   Financial resource strain: Not on file   Food insecurity    Worry: Not on file    Inability: Not on file   Transportation needs    Medical: Not on file    Non-medical: Not on file  Tobacco Use   Smoking status: Never Smoker   Smokeless tobacco: Never Used  Substance and Sexual Activity   Alcohol use: No   Drug use: No   Sexual activity: Not on file  Lifestyle   Physical activity  Days per week: Not on file    Minutes per session: Not on file   Stress: Not on file  Relationships   Social connections    Talks on phone: Not on file    Gets together: Not on file    Attends religious service: Not on file    Active member of club or organization: Not on file    Attends meetings of clubs or organizations: Not on file    Relationship status: Not on file   Intimate partner violence    Fear of current or ex partner: Not on file    Emotionally abused: Not on file    Physically abused: Not on file    Forced sexual activity: Not on file  Other Topics Concern   Not on file  Social History Narrative   Not on file    Review of Systems Per HPI.     Objective:   Physical Exam Vitals signs reviewed.    Constitutional:      General: She is not in acute distress.    Appearance: She is well-developed.  HENT:     Head: Normocephalic and atraumatic.     Right Ear: Hearing, tympanic membrane, ear canal and external ear normal.     Left Ear: Hearing, tympanic membrane, ear canal and external ear normal.     Nose: Nose normal.     Mouth/Throat:     Pharynx: No oropharyngeal exudate.  Eyes:     Conjunctiva/sclera: Conjunctivae normal.     Pupils: Pupils are equal, round, and reactive to light.  Cardiovascular:     Rate and Rhythm: Normal rate and regular rhythm.     Heart sounds: Normal heart sounds. No murmur.  Pulmonary:     Effort: Pulmonary effort is normal. No respiratory distress.     Breath sounds: Normal breath sounds. No wheezing or rhonchi.  Skin:    General: Skin is warm and dry.     Findings: No rash.  Neurological:     Mental Status: She is alert and oriented to person, place, and time.  Psychiatric:        Behavior: Behavior normal.    Vitals:   10/10/19 1650  BP: 111/69  Pulse: (!) 58  Temp: 98 F (36.7 C)  TempSrc: Oral  SpO2: 97%  Weight: 144 lb 6.4 oz (65.5 kg)       Assessment & Plan:   Sylvia Pruitt is a 63 y.o. female Globus sensation - Plan: Ambulatory referral to ENT, omeprazole (PRILOSEC) 20 MG capsule Persistent globus sensation, sensation of mucus in back of throat.  Status post ENT eval, will endoscopy, other work-up per previous notes.  Did not notice appreciable difference with Flonase nasal spray for possible postnasal drip from allergies.  Additionally did not notice improvement with Astelin nasal spray from other provider or amitriptyline which additionally caused side effects as above.  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  Unfortunately has not had follow-up with gastroenterology for endoscopy versus pH monitoring.  -Recommended avoid menthol as potentially that could worsen  laryngeal pharyngeal reflux.  -Restart omeprazole 20 mg twice daily, follow-up with gastroenterology.  -We will refer to other ENT for second opinion.  Additionally note provided for her to exercise at gym, but did recommend wearing mask and social distancing along with washing hands, cleaning any shared equipment.  Meds ordered this encounter  Medications   omeprazole (PRILOSEC) 20 MG capsule  Sig: Take 1 capsule (20 mg total) by mouth 2 (two) times daily before a meal.    Dispense:  60 capsule    Refill:  1   Patient Instructions    I would reach out to gastroenterology to discuss [place] probe or endoscopy to rule out acid reflux. I would recommend trying omeprazole again for now.  I would avoid menthol, mint for now.  I will refer you to ENT at [facility].     If you have lab work done today you will be contacted with your lab results within the next 2 weeks.  If you have not heard from [place] then please contact us. The fastest way to get your results is to register for My Chart.   IF you received an x-ray today, you will receive an invoice from [facility]. Please contact [facility] at [phone] with questions or concerns regarding your invoice.   IF you received labwork today, you will receive an invoice from [facility]. Please contact [facility] at [phone] with questions or concerns regarding your invoice.   Our billing staff will not be able to assist you with questions regarding bills from these companies.  You will be contacted with the lab results as soon as they are available. The fastest way to get your results is to activate your My Chart account. Instructions are located on the last page of this paperwork. If you have not heard from [place] regarding the results in 2 weeks, please contact this office.       Signed,   [name], MD [facility] [place].  10/10/19 7:15 PM

## 2019-10-10 NOTE — Patient Instructions (Addendum)
  I would reach out to gastroenterology to discuss [place] probe or endoscopy to rule out acid reflux. I would recommend trying omeprazole again for now.  I would avoid menthol, mint for now.  I will refer you to ENT at [facility].     If you have lab work done today you will be contacted with your lab results within the next 2 weeks.  If you have not heard from [place] then please contact us. The fastest way to get your results is to register for My Chart.   IF you received an x-ray today, you will receive an invoice from [facility]. Please contact [facility] at [phone] with questions or concerns regarding your invoice.   IF you received labwork today, you will receive an invoice from [facility]. Please contact [facility] at [phone] with questions or concerns regarding your invoice.   Our billing staff will not be able to assist you with questions regarding bills from these companies.  You will be contacted with the lab results as soon as they are available. The fastest way to get your results is to activate your My Chart account. Instructions are located on the last page of this paperwork. If you have not heard from [place] regarding the results in 2 weeks, please contact this office.

## 2020-03-12 ENCOUNTER — Other Ambulatory Visit: Payer: Self-pay | Admitting: Family Medicine

## 2020-03-12 DIAGNOSIS — E039 Hypothyroidism, unspecified: Secondary | ICD-10-CM

## 2020-03-12 NOTE — Telephone Encounter (Signed)
Requested Prescriptions  Pending Prescriptions Disp Refills  . levothyroxine (SYNTHROID) 50 MCG tablet [Pharmacy Med Name: LEVOTHYROXINE 0.05MG  (50MCG) TAB] 90 tablet 2    Sig: TAKE 1 TABLET BY MOUTH EVERY DAY     Endocrinology:  Hypothyroid Agents Failed - 03/12/2020  1:49 PM      Failed - TSH needs to be rechecked within 3 months after an abnormal result. Refill until TSH is due.      Passed - TSH in normal range and within 360 days    TSH  Date Value Ref Range Status  05/26/2019 2.070 0.450 - 4.500 uIU/mL Final         Passed - Valid encounter within last 12 months    Recent Outpatient Visits          5 months ago Globus sensation   [facility], Ranell Patrick, MD   6 months ago Lipoma of chest wall   [facility], Ranell Patrick, MD   9 months ago Hyperglycemia   [facility], Ines Bloomer, MD   9 months ago Globus sensation   [facility], Ranell Patrick, MD   1 year ago Abdominal pain, RLQ   [facility] at [facility], Renette Butters, MD

## 2020-06-06 ENCOUNTER — Other Ambulatory Visit: Payer: Self-pay | Admitting: Family Medicine

## 2020-06-06 DIAGNOSIS — M79671 Pain in right foot: Secondary | ICD-10-CM

## 2020-06-06 NOTE — Telephone Encounter (Signed)
Requested Prescriptions  Pending Prescriptions Disp Refills  . gabapentin (NEURONTIN) 300 MG capsule [Pharmacy Med Name: GABAPENTIN 300MG  CAPSULES] 720 capsule 0    Sig: TAKE 2 CAPSULES BY MOUTH FOUR TIMES DAILY     Neurology: Anticonvulsants - gabapentin Passed - 06/06/2020  5:15 PM      Passed - Valid encounter within last 12 months    Recent Outpatient Visits          8 months ago Globus sensation   [facility], Ranell Patrick, MD   9 months ago Lipoma of chest wall   [facility], Ranell Patrick, MD   1 year ago Hyperglycemia   [facility], Ines Bloomer, MD   1 year ago Globus sensation   [facility], Ranell Patrick, MD   2 years ago Abdominal pain, RLQ   [facility] at [facility], Renette Butters, MD

## 2021-04-30 ENCOUNTER — Ambulatory Visit: Payer: Self-pay

## 2021-04-30 ENCOUNTER — Other Ambulatory Visit: Payer: Self-pay

## 2021-04-30 ENCOUNTER — Encounter: Payer: Self-pay | Admitting: Family Medicine

## 2021-04-30 ENCOUNTER — Ambulatory Visit: Payer: BC Managed Care – PPO | Admitting: Family Medicine

## 2021-04-30 VITALS — BP 134/80 | Ht 71.0 in | Wt 155.0 lb

## 2021-04-30 DIAGNOSIS — M79671 Pain in right foot: Secondary | ICD-10-CM

## 2021-04-30 NOTE — Patient Instructions (Signed)
Nice to meet you Please try the pennsaid  Please try the exercises  Please send me a message in MyChart with any questions or updates.  Please see me back in 2-3 weeks.   --Dr. Raeford Razor

## 2021-04-30 NOTE — Progress Notes (Signed)
  Sylvia Pruitt - 65 y.o. female MRN [id number]  Date of birth: 19-Dec-1955  SUBJECTIVE:  Including CC & ROS.  No chief complaint on file.   Sylvia Pruitt is a 65 y.o. female that is presenting with right foot pain.  She is having pain and altered sensation over the fourth digit of the right foot.  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  Has tried physical therapy with limited improvement.  Is chronically on gabapentin.   Review of Systems See HPI   HISTORY: Past Medical, Surgical, Social, and Family History Reviewed & Updated per EMR.   Pertinent Historical Findings include:  Past Medical History:  Diagnosis Date  . Elevated lipids   . Foot pain, right   . Hypothyroid     History reviewed. No pertinent surgical history.  Family History  Problem Relation Age of Onset  . Heart disease Father     Social History   Socioeconomic History  . Marital status: Single    Spouse name: Not on file  . Number of children: Not on file  . Years of education: Not on file  . Highest education level: Not on file  Occupational History  . Not on file  Tobacco Use  . Smoking status: Never Smoker  . Smokeless tobacco: Never Used  Vaping Use  . Vaping Use: Never used  Substance and Sexual Activity  . Alcohol use: No  . Drug use: No  . Sexual activity: Not on file  Other Topics Concern  . Not on file  Social History Narrative  . Not on file   Social Determinants of Health   Financial Resource Strain: Not on file  Food Insecurity: Not on file  Transportation Needs: Not on file  Physical Activity: Not on file  Stress: Not on file  Social Connections: Not on file  Intimate Partner Violence: Not on file     PHYSICAL EXAM:  VS: BP 134/80 (BP Location: Left Arm, Patient Position: Sitting, Cuff Size: Normal)   Ht 5\' 11"  (1.803 m)   Wt 155 lb (70.3 kg)   BMI 21.62 kg/m  Physical Exam Gen: NAD, alert, cooperative with exam,  well-appearing MSK:  Right foot: Pes cavus. Mild loss of the transverse arch. Loss of motion between the second and third digit. Some tenderness to palpation between the third and fourth interdigital space. Neurovascularly intact  Limited ultrasound: Right foot:  No changes observed at the second, third and fourth MTP joints. Increased hyperemia observed between the third and fourth and the fourth and fifth interdigital space. No cobblestoning or soft tissue changes  Summary: Nonspecific inflammatory changes in the interdigital space.  Ultrasound and interpretation by Clearance Coots, MD    ASSESSMENT & PLAN:   Right foot pain Nonspecific inflammatory changes in the interdigit space between the third and fourth and fourth and fifth.  Has limited motion between the second and third digit.  Has a fairly cavus foot. -Counseled on home exercise therapy and supportive care. -Pennsaid samples. -Forefoot blue Poron cushioning. -Metatarsal pad placed. -Could consider injection versus neuroma pad.

## 2021-04-30 NOTE — Assessment & Plan Note (Signed)
Nonspecific inflammatory changes in the interdigit space between the third and fourth and fourth and fifth.  Has limited motion between the second and third digit.  Has a fairly cavus foot. -Counseled on home exercise therapy and supportive care. -Pennsaid samples. -Forefoot blue Poron cushioning. -Metatarsal pad placed. -Could consider injection versus neuroma pad.

## 2021-05-16 ENCOUNTER — Ambulatory Visit: Payer: BC Managed Care – PPO | Admitting: Family Medicine

## 2021-05-28 ENCOUNTER — Ambulatory Visit: Payer: BC Managed Care – PPO | Admitting: Family Medicine

## 2021-10-02 ENCOUNTER — Encounter: Payer: Self-pay | Admitting: Gastroenterology

## 2021-10-25 ENCOUNTER — Ambulatory Visit: Payer: BC Managed Care – PPO | Admitting: Gastroenterology

## 2022-05-05 ENCOUNTER — Other Ambulatory Visit: Payer: Self-pay | Admitting: Family Medicine

## 2022-05-05 DIAGNOSIS — E2839 Other primary ovarian failure: Secondary | ICD-10-CM

## 2022-06-12 ENCOUNTER — Other Ambulatory Visit: Payer: BC Managed Care – PPO

## 2022-06-16 DIAGNOSIS — K2930 Chronic superficial gastritis without bleeding: Secondary | ICD-10-CM | POA: Diagnosis not present

## 2022-06-24 ENCOUNTER — Ambulatory Visit
Admission: RE | Admit: 2022-06-24 | Discharge: 2022-06-24 | Disposition: A | Discharge status: Home / Self Care | Payer: Medicare PPO | Source: Ambulatory Visit | Attending: Family Medicine | Admitting: Family Medicine

## 2022-06-24 DIAGNOSIS — M8589 Other specified disorders of bone density and structure, multiple sites: Secondary | ICD-10-CM | POA: Diagnosis not present

## 2022-06-24 DIAGNOSIS — M810 Age-related osteoporosis without current pathological fracture: Secondary | ICD-10-CM | POA: Diagnosis not present

## 2022-06-24 DIAGNOSIS — E2839 Other primary ovarian failure: Secondary | ICD-10-CM

## 2022-06-24 DIAGNOSIS — Z780 Asymptomatic menopausal state: Secondary | ICD-10-CM | POA: Diagnosis not present

## 2022-07-11 DIAGNOSIS — M810 Age-related osteoporosis without current pathological fracture: Secondary | ICD-10-CM | POA: Diagnosis not present

## 2022-07-15 DIAGNOSIS — L089 Local infection of the skin and subcutaneous tissue, unspecified: Secondary | ICD-10-CM | POA: Diagnosis not present

## 2022-07-23 DIAGNOSIS — L820 Inflamed seborrheic keratosis: Secondary | ICD-10-CM | POA: Diagnosis not present

## 2022-07-23 DIAGNOSIS — D0439 Carcinoma in situ of skin of other parts of face: Secondary | ICD-10-CM | POA: Diagnosis not present

## 2022-07-23 DIAGNOSIS — L03011 Cellulitis of right finger: Secondary | ICD-10-CM | POA: Diagnosis not present

## 2022-07-23 DIAGNOSIS — D485 Neoplasm of uncertain behavior of skin: Secondary | ICD-10-CM | POA: Diagnosis not present

## 2022-07-23 DIAGNOSIS — Z85828 Personal history of other malignant neoplasm of skin: Secondary | ICD-10-CM | POA: Diagnosis not present

## 2022-08-01 DIAGNOSIS — M25551 Pain in right hip: Secondary | ICD-10-CM | POA: Diagnosis not present

## 2022-08-01 DIAGNOSIS — M25521 Pain in right elbow: Secondary | ICD-10-CM | POA: Diagnosis not present

## 2022-08-01 DIAGNOSIS — M546 Pain in thoracic spine: Secondary | ICD-10-CM | POA: Diagnosis not present

## 2022-08-01 DIAGNOSIS — M25511 Pain in right shoulder: Secondary | ICD-10-CM | POA: Diagnosis not present

## 2022-08-26 DIAGNOSIS — Z85828 Personal history of other malignant neoplasm of skin: Secondary | ICD-10-CM | POA: Diagnosis not present

## 2022-08-26 DIAGNOSIS — C44329 Squamous cell carcinoma of skin of other parts of face: Secondary | ICD-10-CM | POA: Diagnosis not present

## 2022-09-24 DIAGNOSIS — M810 Age-related osteoporosis without current pathological fracture: Secondary | ICD-10-CM | POA: Diagnosis not present

## 2022-09-24 DIAGNOSIS — K219 Gastro-esophageal reflux disease without esophagitis: Secondary | ICD-10-CM | POA: Diagnosis not present

## 2022-09-24 DIAGNOSIS — E039 Hypothyroidism, unspecified: Secondary | ICD-10-CM | POA: Diagnosis not present

## 2022-10-20 DIAGNOSIS — K219 Gastro-esophageal reflux disease without esophagitis: Secondary | ICD-10-CM | POA: Diagnosis not present

## 2022-10-20 DIAGNOSIS — M810 Age-related osteoporosis without current pathological fracture: Secondary | ICD-10-CM | POA: Diagnosis not present

## 2022-10-20 DIAGNOSIS — G629 Polyneuropathy, unspecified: Secondary | ICD-10-CM | POA: Diagnosis not present

## 2022-10-20 DIAGNOSIS — E039 Hypothyroidism, unspecified: Secondary | ICD-10-CM | POA: Diagnosis not present

## 2022-10-20 DIAGNOSIS — E7800 Pure hypercholesterolemia, unspecified: Secondary | ICD-10-CM | POA: Diagnosis not present

## 2022-12-17 DIAGNOSIS — G5781 Other specified mononeuropathies of right lower limb: Secondary | ICD-10-CM | POA: Diagnosis not present

## 2023-01-22 DIAGNOSIS — G5781 Other specified mononeuropathies of right lower limb: Secondary | ICD-10-CM | POA: Diagnosis not present

## 2023-03-30 ENCOUNTER — Encounter: Payer: Self-pay | Admitting: *Deleted

## 2023-04-07 DIAGNOSIS — D2272 Melanocytic nevi of left lower limb, including hip: Secondary | ICD-10-CM | POA: Diagnosis not present

## 2023-04-07 DIAGNOSIS — L821 Other seborrheic keratosis: Secondary | ICD-10-CM | POA: Diagnosis not present

## 2023-04-07 DIAGNOSIS — L820 Inflamed seborrheic keratosis: Secondary | ICD-10-CM | POA: Diagnosis not present

## 2023-04-07 DIAGNOSIS — D225 Melanocytic nevi of trunk: Secondary | ICD-10-CM | POA: Diagnosis not present

## 2023-04-07 DIAGNOSIS — L812 Freckles: Secondary | ICD-10-CM | POA: Diagnosis not present

## 2023-04-07 DIAGNOSIS — D2271 Melanocytic nevi of right lower limb, including hip: Secondary | ICD-10-CM | POA: Diagnosis not present

## 2023-04-07 DIAGNOSIS — Z85828 Personal history of other malignant neoplasm of skin: Secondary | ICD-10-CM | POA: Diagnosis not present

## 2023-04-07 DIAGNOSIS — D485 Neoplasm of uncertain behavior of skin: Secondary | ICD-10-CM | POA: Diagnosis not present

## 2023-05-19 DIAGNOSIS — K219 Gastro-esophageal reflux disease without esophagitis: Secondary | ICD-10-CM | POA: Diagnosis not present

## 2023-05-19 DIAGNOSIS — G629 Polyneuropathy, unspecified: Secondary | ICD-10-CM | POA: Diagnosis not present

## 2023-05-19 DIAGNOSIS — E7800 Pure hypercholesterolemia, unspecified: Secondary | ICD-10-CM | POA: Diagnosis not present

## 2023-05-19 DIAGNOSIS — R0989 Other specified symptoms and signs involving the circulatory and respiratory systems: Secondary | ICD-10-CM | POA: Diagnosis not present

## 2023-05-19 DIAGNOSIS — Z0000 Encounter for general adult medical examination without abnormal findings: Secondary | ICD-10-CM | POA: Diagnosis not present

## 2023-05-19 DIAGNOSIS — Z1211 Encounter for screening for malignant neoplasm of colon: Secondary | ICD-10-CM | POA: Diagnosis not present

## 2023-05-19 DIAGNOSIS — E039 Hypothyroidism, unspecified: Secondary | ICD-10-CM | POA: Diagnosis not present

## 2023-05-19 DIAGNOSIS — M810 Age-related osteoporosis without current pathological fracture: Secondary | ICD-10-CM | POA: Diagnosis not present

## 2023-05-19 DIAGNOSIS — M25511 Pain in right shoulder: Secondary | ICD-10-CM | POA: Diagnosis not present

## 2023-05-21 DIAGNOSIS — Z1211 Encounter for screening for malignant neoplasm of colon: Secondary | ICD-10-CM | POA: Diagnosis not present

## 2023-06-04 DIAGNOSIS — D485 Neoplasm of uncertain behavior of skin: Secondary | ICD-10-CM | POA: Diagnosis not present

## 2023-06-04 DIAGNOSIS — Z85828 Personal history of other malignant neoplasm of skin: Secondary | ICD-10-CM | POA: Diagnosis not present

## 2023-06-04 DIAGNOSIS — D225 Melanocytic nevi of trunk: Secondary | ICD-10-CM | POA: Diagnosis not present

## 2023-06-08 DIAGNOSIS — Z1231 Encounter for screening mammogram for malignant neoplasm of breast: Secondary | ICD-10-CM | POA: Diagnosis not present

## 2023-06-08 DIAGNOSIS — R92323 Mammographic fibroglandular density, bilateral breasts: Secondary | ICD-10-CM | POA: Diagnosis not present

## 2023-07-02 DIAGNOSIS — E039 Hypothyroidism, unspecified: Secondary | ICD-10-CM | POA: Diagnosis not present

## 2023-08-12 DIAGNOSIS — H31091 Other chorioretinal scars, right eye: Secondary | ICD-10-CM | POA: Diagnosis not present

## 2023-08-12 DIAGNOSIS — H0102A Squamous blepharitis right eye, upper and lower eyelids: Secondary | ICD-10-CM | POA: Diagnosis not present

## 2023-08-12 DIAGNOSIS — H0102B Squamous blepharitis left eye, upper and lower eyelids: Secondary | ICD-10-CM | POA: Diagnosis not present

## 2023-08-12 DIAGNOSIS — H5213 Myopia, bilateral: Secondary | ICD-10-CM | POA: Diagnosis not present

## 2023-08-12 DIAGNOSIS — H2513 Age-related nuclear cataract, bilateral: Secondary | ICD-10-CM | POA: Diagnosis not present

## 2023-08-12 DIAGNOSIS — H524 Presbyopia: Secondary | ICD-10-CM | POA: Diagnosis not present

## 2023-08-14 DIAGNOSIS — E039 Hypothyroidism, unspecified: Secondary | ICD-10-CM | POA: Diagnosis not present

## 2023-09-16 DIAGNOSIS — H35452 Secondary pigmentary degeneration, left eye: Secondary | ICD-10-CM | POA: Diagnosis not present

## 2023-09-16 DIAGNOSIS — H43811 Vitreous degeneration, right eye: Secondary | ICD-10-CM | POA: Diagnosis not present

## 2023-09-16 DIAGNOSIS — H25811 Combined forms of age-related cataract, right eye: Secondary | ICD-10-CM | POA: Diagnosis not present

## 2023-09-16 DIAGNOSIS — H33311 Horseshoe tear of retina without detachment, right eye: Secondary | ICD-10-CM | POA: Diagnosis not present

## 2023-09-16 DIAGNOSIS — H43391 Other vitreous opacities, right eye: Secondary | ICD-10-CM | POA: Diagnosis not present

## 2023-09-16 DIAGNOSIS — H31091 Other chorioretinal scars, right eye: Secondary | ICD-10-CM | POA: Diagnosis not present

## 2023-09-25 DIAGNOSIS — M546 Pain in thoracic spine: Secondary | ICD-10-CM | POA: Diagnosis not present

## 2023-09-29 DIAGNOSIS — E039 Hypothyroidism, unspecified: Secondary | ICD-10-CM | POA: Diagnosis not present

## 2023-10-07 DIAGNOSIS — M546 Pain in thoracic spine: Secondary | ICD-10-CM | POA: Diagnosis not present

## 2023-10-21 DIAGNOSIS — M546 Pain in thoracic spine: Secondary | ICD-10-CM | POA: Diagnosis not present

## 2023-10-27 DIAGNOSIS — L821 Other seborrheic keratosis: Secondary | ICD-10-CM | POA: Diagnosis not present

## 2023-10-27 DIAGNOSIS — Z85828 Personal history of other malignant neoplasm of skin: Secondary | ICD-10-CM | POA: Diagnosis not present

## 2023-10-27 DIAGNOSIS — L820 Inflamed seborrheic keratosis: Secondary | ICD-10-CM | POA: Diagnosis not present

## 2023-11-02 DIAGNOSIS — M546 Pain in thoracic spine: Secondary | ICD-10-CM | POA: Diagnosis not present

## 2023-11-16 DIAGNOSIS — E559 Vitamin D deficiency, unspecified: Secondary | ICD-10-CM | POA: Diagnosis not present

## 2023-11-16 DIAGNOSIS — E039 Hypothyroidism, unspecified: Secondary | ICD-10-CM | POA: Diagnosis not present

## 2023-11-16 DIAGNOSIS — E7800 Pure hypercholesterolemia, unspecified: Secondary | ICD-10-CM | POA: Diagnosis not present

## 2023-11-19 DIAGNOSIS — E559 Vitamin D deficiency, unspecified: Secondary | ICD-10-CM | POA: Diagnosis not present

## 2023-11-19 DIAGNOSIS — K219 Gastro-esophageal reflux disease without esophagitis: Secondary | ICD-10-CM | POA: Diagnosis not present

## 2023-11-19 DIAGNOSIS — G629 Polyneuropathy, unspecified: Secondary | ICD-10-CM | POA: Diagnosis not present

## 2023-11-19 DIAGNOSIS — E7800 Pure hypercholesterolemia, unspecified: Secondary | ICD-10-CM | POA: Diagnosis not present

## 2023-11-19 DIAGNOSIS — E039 Hypothyroidism, unspecified: Secondary | ICD-10-CM | POA: Diagnosis not present

## 2023-11-19 DIAGNOSIS — M810 Age-related osteoporosis without current pathological fracture: Secondary | ICD-10-CM | POA: Diagnosis not present

## 2024-01-11 DIAGNOSIS — M25511 Pain in right shoulder: Secondary | ICD-10-CM | POA: Diagnosis not present

## 2024-01-13 DIAGNOSIS — R531 Weakness: Secondary | ICD-10-CM | POA: Diagnosis not present

## 2024-01-13 DIAGNOSIS — M25511 Pain in right shoulder: Secondary | ICD-10-CM | POA: Diagnosis not present

## 2024-01-20 DIAGNOSIS — R531 Weakness: Secondary | ICD-10-CM | POA: Diagnosis not present

## 2024-01-20 DIAGNOSIS — M25511 Pain in right shoulder: Secondary | ICD-10-CM | POA: Diagnosis not present

## 2024-01-22 DIAGNOSIS — M25511 Pain in right shoulder: Secondary | ICD-10-CM | POA: Diagnosis not present

## 2024-01-22 DIAGNOSIS — R531 Weakness: Secondary | ICD-10-CM | POA: Diagnosis not present

## 2024-02-01 DIAGNOSIS — M25511 Pain in right shoulder: Secondary | ICD-10-CM | POA: Diagnosis not present

## 2024-02-01 DIAGNOSIS — R531 Weakness: Secondary | ICD-10-CM | POA: Diagnosis not present

## 2024-02-08 DIAGNOSIS — R531 Weakness: Secondary | ICD-10-CM | POA: Diagnosis not present

## 2024-02-08 DIAGNOSIS — M25511 Pain in right shoulder: Secondary | ICD-10-CM | POA: Diagnosis not present

## 2024-02-10 DIAGNOSIS — R531 Weakness: Secondary | ICD-10-CM | POA: Diagnosis not present

## 2024-02-10 DIAGNOSIS — M25511 Pain in right shoulder: Secondary | ICD-10-CM | POA: Diagnosis not present

## 2024-06-08 ENCOUNTER — Encounter (HOSPITAL_COMMUNITY): Payer: Self-pay

## 2024-06-08 ENCOUNTER — Emergency Department (HOSPITAL_COMMUNITY)

## 2024-06-08 ENCOUNTER — Other Ambulatory Visit: Payer: Self-pay

## 2024-06-08 ENCOUNTER — Emergency Department (HOSPITAL_COMMUNITY)
Admission: EM | Admit: 2024-06-08 | Discharge: 2024-06-09 | Disposition: A | Discharge status: Home / Self Care | Source: Home / Self Care | Attending: Emergency Medicine | Admitting: Emergency Medicine

## 2024-06-08 DIAGNOSIS — W19XXXA Unspecified fall, initial encounter: Secondary | ICD-10-CM | POA: Diagnosis not present

## 2024-06-08 DIAGNOSIS — S42409A Unspecified fracture of lower end of unspecified humerus, initial encounter for closed fracture: Secondary | ICD-10-CM

## 2024-06-08 DIAGNOSIS — S42401A Unspecified fracture of lower end of right humerus, initial encounter for closed fracture: Secondary | ICD-10-CM | POA: Insufficient documentation

## 2024-06-08 DIAGNOSIS — G5621 Lesion of ulnar nerve, right upper limb: Secondary | ICD-10-CM | POA: Diagnosis not present

## 2024-06-08 DIAGNOSIS — Y92009 Unspecified place in unspecified non-institutional (private) residence as the place of occurrence of the external cause: Secondary | ICD-10-CM | POA: Diagnosis not present

## 2024-06-08 DIAGNOSIS — W010XXA Fall on same level from slipping, tripping and stumbling without subsequent striking against object, initial encounter: Secondary | ICD-10-CM | POA: Diagnosis not present

## 2024-06-08 DIAGNOSIS — R001 Bradycardia, unspecified: Secondary | ICD-10-CM | POA: Diagnosis not present

## 2024-06-08 DIAGNOSIS — S59901A Unspecified injury of right elbow, initial encounter: Secondary | ICD-10-CM | POA: Diagnosis not present

## 2024-06-08 DIAGNOSIS — E039 Hypothyroidism, unspecified: Secondary | ICD-10-CM | POA: Diagnosis not present

## 2024-06-08 DIAGNOSIS — Z79890 Hormone replacement therapy (postmenopausal): Secondary | ICD-10-CM | POA: Diagnosis not present

## 2024-06-08 DIAGNOSIS — S42331A Displaced oblique fracture of shaft of humerus, right arm, initial encounter for closed fracture: Secondary | ICD-10-CM | POA: Diagnosis not present

## 2024-06-08 DIAGNOSIS — S42491A Other displaced fracture of lower end of right humerus, initial encounter for closed fracture: Secondary | ICD-10-CM | POA: Diagnosis not present

## 2024-06-08 DIAGNOSIS — M25421 Effusion, right elbow: Secondary | ICD-10-CM | POA: Diagnosis not present

## 2024-06-08 DIAGNOSIS — S4990XA Unspecified injury of shoulder and upper arm, unspecified arm, initial encounter: Secondary | ICD-10-CM | POA: Diagnosis not present

## 2024-06-08 DIAGNOSIS — I10 Essential (primary) hypertension: Secondary | ICD-10-CM | POA: Diagnosis not present

## 2024-06-08 MED ORDER — MORPHINE SULFATE (PF) 4 MG/ML IV SOLN
4.0000 mg | Freq: Once | INTRAVENOUS | Status: AC
  Administered 2024-06-08: 4 mg via INTRAVENOUS
  Filled 2024-06-08: qty 1

## 2024-06-08 NOTE — ED Provider Notes (Signed)
  [facility] [facility] AT [facility] Provider Note   CSN: [id number] Arrival date & time: 06/08/24  2257     Patient presents with: Sylvia Pruitt is a 68 y.o. female.  {Add pertinent medical, surgical, social history, OB history to [id number]}  Fall       Prior to Admission medications   Medication Sig Start Date End Date Taking? Authorizing Provider  gabapentin  (NEURONTIN ) 300 MG capsule TAKE 2 CAPSULES BY MOUTH FOUR TIMES DAILY 06/06/20   Levora Reyes SAUNDERS, MD  levothyroxine  (SYNTHROID ) 50 MCG tablet TAKE 1 TABLET BY MOUTH EVERY DAY 03/12/20   Levora Reyes SAUNDERS, MD  omeprazole  (PRILOSEC) 20 MG capsule Take 1 capsule (20 mg total) by mouth 2 (two) times daily before a meal. 10/10/19   Levora Reyes SAUNDERS, MD    Allergies: Patient has no known allergies.    Review of Systems  Updated Vital Signs BP (!) 173/84 (BP Location: Left Arm)   Pulse (!) 55   Temp (!) 97.4 F (36.3 C) (Oral)   Resp 20   SpO2 98%   Physical Exam  (all labs ordered are listed, but only abnormal results are displayed) Labs Reviewed - No data to display  EKG: None  Radiology: No results found.  {Document cardiac monitor, telemetry assessment procedure when appropriate:32947} Procedures   Medications Ordered in the ED  morphine (PF) 4 MG/ML injection 4 mg (has no administration in time range)      {Click here for ABCD2, HEART and other calculators REFRESH Note before signing:1}                              Medical Decision Making Amount and/or Complexity of Data Reviewed Radiology: ordered.  Risk Prescription drug management.   ***  {Document critical care time when appropriate  Document review of labs and clinical decision tools ie CHADS2VASC2, etc  Document your independent review of radiology images and any outside records  Document your discussion with family members, caretakers and with consultants  Document social determinants of health  affecting pt's care  Document your decision making why or why not admission, treatments were needed:32947:::1}   Final diagnoses:  None    ED Discharge Orders     None

## 2024-06-08 NOTE — ED Triage Notes (Signed)
 Pt came in via EMS from home w/ c/o of a fall. Pt landed on right elbow. No obvious deformity, but bruising noted. Denies hitting head or LOC. No thinners.  150 mcg fentanyl given on EMS Sling applied

## 2024-06-09 ENCOUNTER — Ambulatory Visit (HOSPITAL_BASED_OUTPATIENT_CLINIC_OR_DEPARTMENT_OTHER): Admitting: Anesthesiology

## 2024-06-09 ENCOUNTER — Other Ambulatory Visit: Payer: Self-pay

## 2024-06-09 ENCOUNTER — Ambulatory Visit (HOSPITAL_BASED_OUTPATIENT_CLINIC_OR_DEPARTMENT_OTHER)
Admission: RE | Admit: 2024-06-09 | Discharge: 2024-06-09 | Disposition: A | Discharge status: Home / Self Care | Attending: Orthopaedic Surgery | Admitting: Orthopaedic Surgery

## 2024-06-09 ENCOUNTER — Encounter (HOSPITAL_BASED_OUTPATIENT_CLINIC_OR_DEPARTMENT_OTHER)
Admission: RE | Disposition: A | Discharge status: Home / Self Care | Payer: Self-pay | Source: Home / Self Care | Attending: Orthopaedic Surgery

## 2024-06-09 ENCOUNTER — Ambulatory Visit (HOSPITAL_COMMUNITY)

## 2024-06-09 ENCOUNTER — Encounter (HOSPITAL_BASED_OUTPATIENT_CLINIC_OR_DEPARTMENT_OTHER): Payer: Self-pay | Admitting: Orthopaedic Surgery

## 2024-06-09 ENCOUNTER — Emergency Department (HOSPITAL_COMMUNITY)

## 2024-06-09 DIAGNOSIS — G8918 Other acute postprocedural pain: Secondary | ICD-10-CM | POA: Diagnosis not present

## 2024-06-09 DIAGNOSIS — Y92009 Unspecified place in unspecified non-institutional (private) residence as the place of occurrence of the external cause: Secondary | ICD-10-CM | POA: Insufficient documentation

## 2024-06-09 DIAGNOSIS — S42411A Displaced simple supracondylar fracture without intercondylar fracture of right humerus, initial encounter for closed fracture: Secondary | ICD-10-CM | POA: Diagnosis not present

## 2024-06-09 DIAGNOSIS — S59901A Unspecified injury of right elbow, initial encounter: Secondary | ICD-10-CM | POA: Diagnosis not present

## 2024-06-09 DIAGNOSIS — M25421 Effusion, right elbow: Secondary | ICD-10-CM | POA: Diagnosis not present

## 2024-06-09 DIAGNOSIS — E039 Hypothyroidism, unspecified: Secondary | ICD-10-CM | POA: Insufficient documentation

## 2024-06-09 DIAGNOSIS — S42401A Unspecified fracture of lower end of right humerus, initial encounter for closed fracture: Secondary | ICD-10-CM | POA: Diagnosis not present

## 2024-06-09 DIAGNOSIS — W010XXA Fall on same level from slipping, tripping and stumbling without subsequent striking against object, initial encounter: Secondary | ICD-10-CM | POA: Insufficient documentation

## 2024-06-09 DIAGNOSIS — Z79890 Hormone replacement therapy (postmenopausal): Secondary | ICD-10-CM | POA: Insufficient documentation

## 2024-06-09 DIAGNOSIS — G5621 Lesion of ulnar nerve, right upper limb: Secondary | ICD-10-CM | POA: Insufficient documentation

## 2024-06-09 DIAGNOSIS — S42491A Other displaced fracture of lower end of right humerus, initial encounter for closed fracture: Secondary | ICD-10-CM | POA: Insufficient documentation

## 2024-06-09 HISTORY — PX: ORIF HUMERUS FRACTURE: SHX2126

## 2024-06-09 LAB — BASIC METABOLIC PANEL WITH GFR
Anion gap: 9 (ref 5–15)
BUN: 13 mg/dL (ref 8–23)
CO2: 24 mmol/L (ref 22–32)
Calcium: 8.6 mg/dL — ABNORMAL LOW (ref 8.9–10.3)
Chloride: 104 mmol/L (ref 98–111)
Creatinine, Ser: 0.86 mg/dL (ref 0.44–1.00)
GFR, Estimated: >60 mL/min (ref >60)
Glucose, Bld: 139 mg/dL — ABNORMAL HIGH (ref 70–99)
Potassium: 3.6 mmol/L (ref 3.5–5.1)
Sodium: 137 mmol/L (ref 135–145)

## 2024-06-09 LAB — CBG MONITORING, ED: Glucose-Capillary: 186 mg/dL — ABNORMAL HIGH (ref 70–99)

## 2024-06-09 LAB — CBC WITH DIFFERENTIAL/PLATELET
Abs Immature Granulocytes: 0.04 10*3/uL (ref 0.00–0.07)
Basophils Absolute: 0 10*3/uL (ref 0.0–0.1)
Basophils Relative: 0 %
Eosinophils Absolute: 0 10*3/uL (ref 0.0–0.5)
Eosinophils Relative: 0 %
HCT: 41 % (ref 36.0–46.0)
Hemoglobin: 13.5 g/dL (ref 12.0–15.0)
Immature Granulocytes: 0 %
Lymphocytes Relative: 8 %
Lymphs Abs: 1 10*3/uL (ref 0.7–4.0)
MCH: 31.1 pg (ref 26.0–34.0)
MCHC: 32.9 g/dL (ref 30.0–36.0)
MCV: 94.5 fL (ref 80.0–100.0)
Monocytes Absolute: 0.8 10*3/uL (ref 0.1–1.0)
Monocytes Relative: 6 %
Neutro Abs: 10.5 10*3/uL — ABNORMAL HIGH (ref 1.7–7.7)
Neutrophils Relative %: 86 %
Platelets: 259 10*3/uL (ref 150–400)
RBC: 4.34 MIL/uL (ref 3.87–5.11)
RDW: 13 % (ref 11.5–15.5)
WBC: 12.4 10*3/uL — ABNORMAL HIGH (ref 4.0–10.5)
nRBC: 0 % (ref 0.0–0.2)

## 2024-06-09 SURGERY — OPEN REDUCTION INTERNAL FIXATION (ORIF) DISTAL HUMERUS FRACTURE
Anesthesia: Regional | Site: Elbow | Laterality: Right

## 2024-06-09 MED ORDER — OXYCODONE HCL 5 MG PO TABS: 5.0000 mg | ORAL_TABLET | Freq: Once | ORAL | Status: DC | PRN

## 2024-06-09 MED ORDER — DEXAMETHASONE SODIUM PHOSPHATE 10 MG/ML IJ SOLN
INTRAMUSCULAR | Status: AC
  Filled 2024-06-09: qty 1

## 2024-06-09 MED ORDER — ONDANSETRON HCL 4 MG/2ML IJ SOLN
INTRAMUSCULAR | Status: AC
  Filled 2024-06-09: qty 2

## 2024-06-09 MED ORDER — 0.9 % SODIUM CHLORIDE (POUR BTL) OPTIME
TOPICAL | Status: DC | PRN
  Administered 2024-06-09: 300 mL

## 2024-06-09 MED ORDER — FENTANYL CITRATE (PF) 100 MCG/2ML IJ SOLN
INTRAMUSCULAR | Status: AC
  Filled 2024-06-09: qty 2

## 2024-06-09 MED ORDER — MORPHINE SULFATE (PF) 4 MG/ML IV SOLN
4.0000 mg | Freq: Once | INTRAVENOUS | Status: AC
  Administered 2024-06-09: 4 mg via INTRAVENOUS
  Filled 2024-06-09: qty 1

## 2024-06-09 MED ORDER — ONDANSETRON HCL 4 MG/2ML IJ SOLN
4.0000 mg | Freq: Once | INTRAMUSCULAR | Status: AC
  Administered 2024-06-09: 4 mg via INTRAVENOUS
  Filled 2024-06-09: qty 2

## 2024-06-09 MED ORDER — GABAPENTIN 300 MG PO CAPS
300.0000 mg | ORAL_CAPSULE | Freq: Once | ORAL | Status: AC
Start: 2024-06-09 — End: 2024-06-09
  Administered 2024-06-09: 300 mg via ORAL

## 2024-06-09 MED ORDER — EPHEDRINE SULFATE (PRESSORS) 50 MG/ML IJ SOLN
INTRAMUSCULAR | Status: DC | PRN
  Administered 2024-06-09 (×2): 10 mg via INTRAVENOUS

## 2024-06-09 MED ORDER — LIDOCAINE 2% (20 MG/ML) 5 ML SYRINGE
INTRAMUSCULAR | Status: AC
Start: 2024-06-09 — End: 2024-06-09
  Filled 2024-06-09: qty 5

## 2024-06-09 MED ORDER — MIDAZOLAM HCL 2 MG/2ML IJ SOLN
INTRAMUSCULAR | Status: AC
Start: 2024-06-09 — End: 2024-06-09
  Filled 2024-06-09: qty 2

## 2024-06-09 MED ORDER — PHENYLEPHRINE 80 MCG/ML (10ML) SYRINGE FOR IV PUSH (FOR BLOOD PRESSURE SUPPORT)
PREFILLED_SYRINGE | INTRAVENOUS | Status: AC
  Filled 2024-06-09: qty 10

## 2024-06-09 MED ORDER — FENTANYL CITRATE PF 50 MCG/ML IJ SOSY
50.0000 ug | PREFILLED_SYRINGE | Freq: Once | INTRAMUSCULAR | Status: AC
  Administered 2024-06-09: 50 ug via INTRAVENOUS
  Filled 2024-06-09: qty 1

## 2024-06-09 MED ORDER — FENTANYL CITRATE (PF) 100 MCG/2ML IJ SOLN
25.0000 ug | INTRAMUSCULAR | Status: DC | PRN
  Administered 2024-06-09 (×2): 50 ug via INTRAVENOUS

## 2024-06-09 MED ORDER — MIDAZOLAM HCL 5 MG/5ML IJ SOLN
INTRAMUSCULAR | Status: DC | PRN
Start: 2024-06-09 — End: 2024-06-09
  Administered 2024-06-09: 1 mg via INTRAVENOUS

## 2024-06-09 MED ORDER — PROPOFOL 10 MG/ML IV BOLUS
INTRAVENOUS | Status: DC | PRN
  Administered 2024-06-09: 120 mg via INTRAVENOUS

## 2024-06-09 MED ORDER — OXYCODONE-ACETAMINOPHEN 5-325 MG PO TABS: 1.0000 | ORAL_TABLET | Freq: Four times a day (QID) | ORAL | 0 refills | Status: DC | PRN

## 2024-06-09 MED ORDER — ONDANSETRON HCL 4 MG/2ML IJ SOLN
INTRAMUSCULAR | Status: DC | PRN
  Administered 2024-06-09: 4 mg via INTRAVENOUS

## 2024-06-09 MED ORDER — ACETAMINOPHEN 500 MG PO TABS: 1000.0000 mg | ORAL_TABLET | Freq: Three times a day (TID) | ORAL | 0 refills | Status: AC

## 2024-06-09 MED ORDER — PHENYLEPHRINE HCL (PRESSORS) 10 MG/ML IV SOLN
INTRAVENOUS | Status: DC | PRN
  Administered 2024-06-09: 80 ug via INTRAVENOUS
  Administered 2024-06-09: 160 ug via INTRAVENOUS
  Administered 2024-06-09 (×2): 80 ug via INTRAVENOUS

## 2024-06-09 MED ORDER — CEFAZOLIN SODIUM-DEXTROSE 2-4 GM/100ML-% IV SOLN
2.0000 g | INTRAVENOUS | Status: AC
  Administered 2024-06-09: 2 g via INTRAVENOUS

## 2024-06-09 MED ORDER — ROCURONIUM BROMIDE 100 MG/10ML IV SOLN
INTRAVENOUS | Status: DC | PRN
  Administered 2024-06-09: 50 mg via INTRAVENOUS

## 2024-06-09 MED ORDER — SUGAMMADEX SODIUM 200 MG/2ML IV SOLN
INTRAVENOUS | Status: DC | PRN
  Administered 2024-06-09: 200 mg via INTRAVENOUS

## 2024-06-09 MED ORDER — CEFAZOLIN SODIUM-DEXTROSE 2-4 GM/100ML-% IV SOLN
INTRAVENOUS | Status: AC
  Filled 2024-06-09: qty 100

## 2024-06-09 MED ORDER — FENTANYL CITRATE (PF) 100 MCG/2ML IJ SOLN
50.0000 ug | Freq: Once | INTRAMUSCULAR | Status: AC
  Administered 2024-06-09: 50 ug via INTRAVENOUS

## 2024-06-09 MED ORDER — AMISULPRIDE (ANTIEMETIC) 5 MG/2ML IV SOLN
10.0000 mg | Freq: Once | INTRAVENOUS | Status: DC | PRN
Start: 2024-06-09 — End: 2024-06-09

## 2024-06-09 MED ORDER — LACTATED RINGERS IV SOLN: INTRAVENOUS | Status: DC

## 2024-06-09 MED ORDER — ACETAMINOPHEN 500 MG PO TABS
ORAL_TABLET | ORAL | Status: AC
  Filled 2024-06-09: qty 2

## 2024-06-09 MED ORDER — LIDOCAINE HCL (CARDIAC) PF 100 MG/5ML IV SOSY
PREFILLED_SYRINGE | INTRAVENOUS | Status: DC | PRN
  Administered 2024-06-09: 60 mg via INTRAVENOUS

## 2024-06-09 MED ORDER — HYDROMORPHONE HCL 1 MG/ML IJ SOLN
1.0000 mg | Freq: Once | INTRAMUSCULAR | Status: AC
  Administered 2024-06-09: 1 mg via INTRAVENOUS
  Filled 2024-06-09: qty 1

## 2024-06-09 MED ORDER — ROCURONIUM BROMIDE 10 MG/ML (PF) SYRINGE
PREFILLED_SYRINGE | INTRAVENOUS | Status: AC
  Filled 2024-06-09: qty 10

## 2024-06-09 MED ORDER — MIDAZOLAM HCL 2 MG/2ML IJ SOLN
1.0000 mg | Freq: Once | INTRAMUSCULAR | Status: AC
  Administered 2024-06-09: 1 mg via INTRAVENOUS

## 2024-06-09 MED ORDER — FENTANYL CITRATE (PF) 100 MCG/2ML IJ SOLN
INTRAMUSCULAR | Status: DC | PRN
  Administered 2024-06-09: 50 ug via INTRAVENOUS

## 2024-06-09 MED ORDER — BUPIVACAINE-EPINEPHRINE (PF) 0.5% -1:200000 IJ SOLN
INTRAMUSCULAR | Status: DC | PRN
Start: 2024-06-09 — End: 2024-06-09
  Administered 2024-06-09: 25 mL via PERINEURAL

## 2024-06-09 MED ORDER — MIDAZOLAM HCL 2 MG/2ML IJ SOLN
INTRAMUSCULAR | Status: AC
  Filled 2024-06-09: qty 2

## 2024-06-09 MED ORDER — CELECOXIB 200 MG PO CAPS: 200.0000 mg | ORAL_CAPSULE | Freq: Two times a day (BID) | ORAL | 0 refills | Status: AC

## 2024-06-09 MED ORDER — EPHEDRINE 5 MG/ML INJ
INTRAVENOUS | Status: AC
  Filled 2024-06-09: qty 5

## 2024-06-09 MED ORDER — DEXAMETHASONE SODIUM PHOSPHATE 4 MG/ML IJ SOLN
INTRAMUSCULAR | Status: DC | PRN
  Administered 2024-06-09: 5 mg via INTRAVENOUS

## 2024-06-09 MED ORDER — OXYCODONE-ACETAMINOPHEN 5-325 MG PO TABS
1.0000 | ORAL_TABLET | Freq: Once | ORAL | Status: AC
  Administered 2024-06-09: 1 via ORAL
  Filled 2024-06-09: qty 1

## 2024-06-09 MED ORDER — ONDANSETRON HCL 4 MG/2ML IJ SOLN: 4.0000 mg | Freq: Once | INTRAMUSCULAR | Status: DC | PRN

## 2024-06-09 MED ORDER — ONDANSETRON HCL 4 MG PO TABS: 4.0000 mg | ORAL_TABLET | Freq: Three times a day (TID) | ORAL | 0 refills | Status: AC | PRN

## 2024-06-09 MED ORDER — ACETAMINOPHEN 500 MG PO TABS
1000.0000 mg | ORAL_TABLET | Freq: Once | ORAL | Status: AC
  Administered 2024-06-09: 1000 mg via ORAL

## 2024-06-09 MED ORDER — GABAPENTIN 300 MG PO CAPS
ORAL_CAPSULE | ORAL | Status: AC
  Filled 2024-06-09: qty 1

## 2024-06-09 MED ORDER — OXYCODONE HCL 5 MG PO TABS: ORAL_TABLET | ORAL | 0 refills | Status: AC

## 2024-06-09 MED ORDER — VANCOMYCIN HCL 1 G IV SOLR
INTRAVENOUS | Status: DC | PRN
  Administered 2024-06-09: 1000 mg via TOPICAL

## 2024-06-09 SURGICAL SUPPLY — 67 items
BIT DRILL 2.5X2.75 QC CALB (BIT) IMPLANT
BIT DRILL 3.5X5.5 QC CALB (BIT) IMPLANT
BIT DRILL CALIBRATED 2.7 (BIT) IMPLANT
BLADE HEX COATED 2.75 (ELECTRODE) ×1 IMPLANT
BLADE SURG 10 STRL SS (BLADE) ×1 IMPLANT
BLADE SURG 15 STRL LF DISP TIS (BLADE) ×1 IMPLANT
BNDG ELASTIC 4INX 5YD STR LF (GAUZE/BANDAGES/DRESSINGS) ×1 IMPLANT
BNDG ESMARK 4X9 LF (GAUZE/BANDAGES/DRESSINGS) IMPLANT
CHLORAPREP W/TINT 26 (MISCELLANEOUS) ×1 IMPLANT
CLSR STERI-STRIP ANTIMIC 1/2X4 (GAUZE/BANDAGES/DRESSINGS) ×1 IMPLANT
CUFF TOURN SGL QUICK 18X3 (MISCELLANEOUS) ×1 IMPLANT
CUFF TRNQT CYL 24X4X16.5-23 (TOURNIQUET CUFF) IMPLANT
DRAPE C-ARM 42X72 X-RAY (DRAPES) IMPLANT
DRAPE C-ARMOR (DRAPES) IMPLANT
DRAPE EXTREMITY T 121X128X90 (DISPOSABLE) ×1 IMPLANT
DRAPE INCISE IOBAN 66X45 STRL (DRAPES) IMPLANT
DRAPE OEC MINIVIEW 54X84 (DRAPES) IMPLANT
DRAPE STERI 35X30 U-POUCH (DRAPES) IMPLANT
DRAPE U-SHAPE 47X51 STRL (DRAPES) ×1 IMPLANT
ELECTRODE REM PT RTRN 9FT ADLT (ELECTROSURGICAL) ×1 IMPLANT
EXTENSION HOSE W/PLC CONNECTON (MISCELLANEOUS) IMPLANT
GAUZE SPONGE 4X4 12PLY STRL (GAUZE/BANDAGES/DRESSINGS) ×1 IMPLANT
GLOVE BIO SURGEON STRL SZ 6.5 (GLOVE) ×1 IMPLANT
GLOVE BIOGEL PI IND STRL 6.5 (GLOVE) ×1 IMPLANT
GLOVE BIOGEL PI IND STRL 8 (GLOVE) ×1 IMPLANT
GLOVE ECLIPSE 8.0 STRL XLNG CF (GLOVE) ×1 IMPLANT
GOWN STRL REUS W/ TWL LRG LVL3 (GOWN DISPOSABLE) ×2 IMPLANT
GOWN STRL REUS W/TWL XL LVL3 (GOWN DISPOSABLE) ×1 IMPLANT
KWIRE FIXATION 2.0X6 (WIRE) IMPLANT
LOOP VASCLR MAXI BLUE 18IN ST (MISCELLANEOUS) IMPLANT
LOOPS VASCLR MAXI BLUE 18IN ST (MISCELLANEOUS) IMPLANT
NS IRRIG 1000ML POUR BTL (IV SOLUTION) ×1 IMPLANT
PACK ARTHROSCOPY DSU (CUSTOM PROCEDURE TRAY) ×1 IMPLANT
PACK BASIN DAY SURGERY FS (CUSTOM PROCEDURE TRAY) ×1 IMPLANT
PAD CAST 4YDX4 CTTN HI CHSV (CAST SUPPLIES) ×1 IMPLANT
PENCIL SMOKE EVACUATOR (MISCELLANEOUS) ×1 IMPLANT
PLATE LOCK R LG 97X10.9X2.5X10 (Plate) IMPLANT
PLATE LOCK RT SM 74X10.7X3.5X9 (Plate) IMPLANT
SCREW CORTICAL 3.5MM 18MM (Screw) IMPLANT
SCREW CORTICAL 3.5MM 22MM (Screw) IMPLANT
SCREW LOCK CORT STAR 3.5X16 (Screw) IMPLANT
SCREW LOCK CORT STAR 3.5X18 (Screw) IMPLANT
SCREW LOCK CORT STAR 3.5X20 (Screw) IMPLANT
SCREW LOCK CORT STAR 3.5X28 (Screw) IMPLANT
SCREW LOCK CORT STAR 3.5X40 (Screw) IMPLANT
SCREW LOCK CORT STAR 3.5X48 (Screw) IMPLANT
SCREW LOCK CORT STAR 3.5X50 (Screw) IMPLANT
SCREW LOW PROFILE 18MMX3.5MM (Screw) IMPLANT
SCREW LOW PROFILE 22MMX3.5MM (Screw) IMPLANT
SCREW NON LOCKING LP 3.5 16MM (Screw) IMPLANT
SHEET MEDIUM DRAPE 40X70 STRL (DRAPES) ×1 IMPLANT
SLEEVE SCD COMPRESS KNEE MED (STOCKING) IMPLANT
SLING ARM FOAM STRAP LRG (SOFTGOODS) ×1 IMPLANT
SPIKE FLUID TRANSFER (MISCELLANEOUS) IMPLANT
SPLINT PLASTER CAST FAST 5X30 (CAST SUPPLIES) ×10 IMPLANT
SPONGE T-LAP 18X18 ~~LOC~~+RFID (SPONGE) ×1 IMPLANT
SUCTION TUBE FRAZIER 10FR DISP (SUCTIONS) IMPLANT
SUT ETHILON 2 0 FS 18 (SUTURE) IMPLANT
SUT MNCRL AB 4-0 PS2 18 (SUTURE) IMPLANT
SUT VIC AB 0 CT1 27XBRD ANBCTR (SUTURE) ×1 IMPLANT
SUT VIC AB 1 CT1 27XBRD ANBCTR (SUTURE) IMPLANT
SUT VIC AB 2-0 CT1 TAPERPNT 27 (SUTURE) IMPLANT
SUT VIC AB 2-0 SH 27XBRD (SUTURE) ×1 IMPLANT
SUT VIC AB 3-0 SH 27X BRD (SUTURE) IMPLANT
TOWEL GREEN STERILE FF (TOWEL DISPOSABLE) ×1 IMPLANT
TUBE CONNECTING 20X1/4 (TUBING) ×1 IMPLANT
TUBE SUCTION HIGH CAP CLEAR NV (SUCTIONS) ×1 IMPLANT

## 2024-06-09 NOTE — ED Notes (Signed)
Ortho Tech called. 

## 2024-06-09 NOTE — Anesthesia Preprocedure Evaluation (Addendum)
 Anesthesia Evaluation  Patient identified by MRN, date of birth, ID band Patient awake    Reviewed: Allergy & Precautions, NPO status , Patient's Chart, lab work & pertinent test results  Airway Mallampati: II  TM Distance: >3 FB Neck ROM: Full    Dental no notable dental hx.    Pulmonary neg pulmonary ROS   Pulmonary exam normal        Cardiovascular negative cardio ROS Normal cardiovascular exam     Neuro/Psych negative neurological ROS  negative psych ROS   GI/Hepatic negative GI ROS, Neg liver ROS,,,  Endo/Other  Hypothyroidism    Renal/GU negative Renal ROS     Musculoskeletal   Abdominal   Peds  Hematology negative hematology ROS (+)   Anesthesia Other Findings RIGHT ELBOW FRACTURE  Reproductive/Obstetrics                             Anesthesia Physical Anesthesia Plan  ASA: 2  Anesthesia Plan: General and Regional   Post-op Pain Management:    Induction: Intravenous  PONV Risk Score and Plan: 3 and Ondansetron , Dexamethasone, Midazolam and Treatment may vary due to age or medical condition  Airway Management Planned: Oral ETT  Additional Equipment:   Intra-op Plan:   Post-operative Plan: Extubation in OR  Informed Consent: I have reviewed the patients History and Physical, chart, labs and discussed the procedure including the risks, benefits and alternatives for the proposed anesthesia with the patient or authorized representative who has indicated his/her understanding and acceptance.     Dental advisory given  Plan Discussed with: CRNA  Anesthesia Plan Comments:        Anesthesia Quick Evaluation

## 2024-06-09 NOTE — Anesthesia Postprocedure Evaluation (Signed)
 Anesthesia Post Note  Patient: Sylvia Pruitt  Procedure(s) Performed: OPEN REDUCTION INTERNAL FIXATION (ORIF) DISTAL HUMERUS FRACTURE (Right: Elbow)     Patient location during evaluation: PACU Anesthesia Type: Regional and General Level of consciousness: awake Pain management: pain level controlled Vital Signs Assessment: post-procedure vital signs reviewed and stable Respiratory status: spontaneous breathing, nonlabored ventilation and respiratory function stable Cardiovascular status: blood pressure returned to baseline and stable Postop Assessment: no apparent nausea or vomiting Anesthetic complications: no   No notable events documented.  Last Vitals:  Vitals:   06/09/24 1430 06/09/24 1445  BP: 132/89 138/68  Pulse: 70 (!) 56  Resp: 15 (!) 142  Temp:    SpO2: 96% 95%    Last Pain:  Vitals:   06/09/24 1444  TempSrc:   PainSc: Asleep                 Delon Aisha Arch

## 2024-06-09 NOTE — Anesthesia Procedure Notes (Signed)
 Procedure Name: Intubation Date/Time: 06/09/2024 12:03 PM  Performed by: Buster Catheryn SAUNDERS, CRNAPre-anesthesia Checklist: Patient identified, Emergency Drugs available, Suction available and Patient being monitored Patient Re-evaluated:Patient Re-evaluated prior to induction Oxygen Delivery Method: Circle system utilized Preoxygenation: Pre-oxygenation with 100% oxygen Induction Type: IV induction Ventilation: Mask ventilation without difficulty Laryngoscope Size: Miller and 2 Grade View: Grade II Tube type: Oral Tube size: 7.0 mm Number of attempts: 1 Airway Equipment and Method: Stylet and Oral airway Placement Confirmation: ETT inserted through vocal cords under direct vision, positive ETCO2 and breath sounds checked- equal and bilateral Secured at: 22 cm Tube secured with: Tape Dental Injury: Teeth and Oropharynx as per pre-operative assessment

## 2024-06-09 NOTE — Discharge Instructions (Addendum)
 Bonner Hair MD, MPH Aleck Stalling, PA-C [facility] [address] [phone] (tel)   [phone] (fax)   POST-OPERATIVE INSTRUCTIONS  WOUND CARE You may remove the Operative Dressing on Post-Op Day #3 (72hrs after surgery).   Alternatively if you would like you can leave dressing on until follow-up if within 7-8 days but keep it dry. Leave steri-strips in place until they fall off on their own, usually 2 weeks postop. There may be a small amount of fluid/bleeding leaking at the surgical site.  This is normal You may change/reinforce the bandage as needed.  Use the Cryocuff or Ice as often as possible for the first 7 days, then as needed for pain relief. Always keep a towel, ACE wrap or other barrier between the cooling unit and your skin.  You may shower on Post-Op Day #3. Gently pat the area dry.  Do not soak the shoulder in water or submerge it.  Keep incisions as dry as possible. Do not go swimming in the pool or ocean until 4 weeks after surgery or when otherwise instructed.    EXERCISES You may wear your sling for comfort You may remove the sling for showering, but keep the arm across the chest or in a secondary sling.    Do not lift anything heavy with your operative arm  It is normal for your fingers/hand to become more swollen after surgery and discolored from bruising.   This will resolve over the first few weeks usually after surgery. Please continue to ambulate and do not stay sitting or lying for too long.  Perform foot and wrist pumps to assist in circulation.  PHYSICAL THERAPY - You will begin physical therapy soon after surgery (unless otherwise specified) - Please call to set up an appointment, if you do not already have one  - Let our office if there are any issues with scheduling your therapy  - Our office will call you to schedule post-op PT  REGIONAL ANESTHESIA (NERVE BLOCKS) The anesthesia team may have performed a  nerve block for you this is a great tool used to minimize pain.   The block may start wearing off overnight (between 8-24 hours postop) When the block wears off, your pain may go from nearly zero to the pain you would have had postop without the block. This is an abrupt transition but nothing dangerous is happening.   This can be a challenging period but utilize your as needed pain medications to try and manage this period. We suggest you use the pain medication the first night prior to going to bed, to ease this transition.  You may take an extra dose of narcotic when this happens if needed  POST-OP MEDICATIONS- Multimodal approach to pain control In general your pain will be controlled with a combination of substances.  Prescriptions unless otherwise discussed are electronically sent to your pharmacy.  This is a carefully made plan we use to minimize narcotic use.     Celebrex - Anti-inflammatory medication taken on a scheduled basis Acetaminophen  - Non-narcotic pain medicine taken on a scheduled basis  Oxycodone  - This is a strong narcotic, to be used only on an "as needed" basis for SEVERE pain. Zofran  - take as needed for nausea   FOLLOW-UP If you develop a Fever (>=101.5), Redness or Drainage from the surgical incision site, please call our office to arrange for an evaluation. Please call the office to schedule a follow-up appointment for your first post-operative appointment,  7-10 days post-operatively.    HELPFUL INFORMATION   You may be more comfortable sleeping in a semi-seated position the first few nights following surgery.  Keep a pillow propped under the elbow and forearm for comfort.  If you have a recliner type of chair it might be beneficial.  If not that is fine too, but it would be helpful to sleep propped up with pillows behind your operated shoulder as well under your elbow and forearm.  This will reduce pulling on the suture lines.  When dressing, put your operative  arm in the sleeve first.  When getting undressed, take your operative arm out last.  Loose fitting, button-down shirts are recommended.  Often in the first days after surgery you may be more comfortable keeping your operative arm under your shirt and not through the sleeve.  You may return to work/school in the next couple of days when you feel up to it.  Desk work and typing in the sling is fine.  We suggest you use the pain medication the first night prior to going to bed, in order to ease any pain when the anesthesia wears off. You should avoid taking pain medications on an empty stomach as it will make you nauseous.  You should wean off your narcotic medicines as soon as you are able.  Most patients will be off narcotics before their first postop appointment.   Do not drink alcoholic beverages or take illicit drugs when taking pain medications.  It is against the law to drive while taking narcotics.  In some states it is against the law to drive while your arm is in a sling.   Pain medication may make you constipated.  Below are a few solutions to try in this order: Decrease the amount of pain medication if you aren't having pain. Drink lots of decaffeinated fluids. Drink prune juice and/or eat dried prunes  If the first 3 don't work start with additional solutions Take Colace - an over-the-counter stool softener Take Senokot - an over-the-counter laxative Take Miralax - a stronger over-the-counter laxative  For more information including helpful videos and documents visit our website:   [email or web address]          Post Anesthesia Home Care Instructions  Activity: Get plenty of rest for the remainder of the day. A responsible individual must stay with you for 24 hours following the procedure.  For the next 24 hours, DO NOT: -Drive a car -Advertising copywriter -Drink alcoholic beverages -Take any medication unless instructed by your  physician -Make any legal decisions or sign important papers.  Meals: Start with liquid foods such as gelatin or soup. Progress to regular foods as tolerated. Avoid greasy, spicy, heavy foods. If nausea and/or vomiting occur, drink only clear liquids until the nausea and/or vomiting subsides. Call your physician if vomiting continues.  Special Instructions/Symptoms: Your throat may feel dry or sore from the anesthesia or the breathing tube placed in your throat during surgery. If this causes discomfort, gargle with warm salt water. The discomfort should disappear within 24 hours.  If you had a scopolamine patch placed behind your ear for the management of post- operative nausea and/or vomiting:  1. The medication in the patch is effective for 72 hours, after which it should be removed.  Wrap patch in a tissue and discard in the trash. Wash hands thoroughly with soap and water. 2. You may remove the patch earlier than 72 hours if you experience unpleasant side effects which may  include dry mouth, dizziness or visual disturbances. 3. Avoid touching the patch. Wash your hands with soap and water after contact with the patch.   Regional Anesthesia Blocks  1. You may not be able to move or feel the blocked extremity after a regional anesthetic block. This may last may last from 3-48 hours after placement, but it will go away. The length of time depends on the medication injected and your individual response to the medication. As the nerves start to wake up, you may experience tingling as the movement and feeling returns to your extremity. If the numbness and inability to move your extremity has not gone away after 48 hours, please call your surgeon.   2. The extremity that is blocked will need to be protected until the numbness is gone and the strength has returned. Because you cannot feel it, you will need to take extra care to avoid injury. Because it may be weak, you may have difficulty moving it  or using it. You may not know what position it is in without looking at it while the block is in effect.  3. For blocks in the legs and feet, returning to weight bearing and walking needs to be done carefully. You will need to wait until the numbness is entirely gone and the strength has returned. You should be able to move your leg and foot normally before you try and bear weight or walk. You will need someone to be with you when you first try to ensure you do not fall and possibly risk injury.  4. Bruising and tenderness at the needle site are common side effects and will resolve in a few days.  5. Persistent numbness or new problems with movement should be communicated to the surgeon or the [facility] [phone] [facility] [phone]).      Next dose of Tylenol  may be given at 4pm if needed.

## 2024-06-09 NOTE — Progress Notes (Signed)
 Orthopedic Tech Progress Note Patient Details:  Sylvia Pruitt 06-May-1956 [id number]  Ortho Devices Type of Ortho Device: Ace wrap, Cotton web roll, Long arm splint Ortho Device/Splint Location: RUE Ortho Device/Splint Interventions: Ordered, Application, Adjustment   Post Interventions Patient Tolerated: Fair Instructions Provided: Care of device   Sylvia Pruitt 06/09/2024, 1:28 AM

## 2024-06-09 NOTE — Interval H&P Note (Signed)
 I discussed the case at length with the patient.  I had a long discussion with the patient regarding her fracture. Patient has a fracture that did not heal in its current alignment with displacement.  Additionally pain control is an issue.  Risk benefits alternatives of surgery to neurovascular and nonunion were discussed.  To proceed.  We may consider olecranon osteotomy however I'm not yet convinced that it will be necessary.

## 2024-06-09 NOTE — Transfer of Care (Signed)
 Immediate Anesthesia Transfer of Care Note  Patient: Sylvia Pruitt  Procedure(s) Performed: OPEN REDUCTION INTERNAL FIXATION (ORIF) DISTAL HUMERUS FRACTURE (Right: Elbow)  Patient Location: PACU  Anesthesia Type:General  Level of Consciousness: awake, alert , and oriented  Airway & Oxygen Therapy: Patient Spontanous Breathing and Patient connected to face mask oxygen  Post-op Assessment: Report given to RN and Post -op Vital signs reviewed and stable  Post vital signs: Reviewed and stable  Last Vitals:  Vitals Value Taken Time  BP 138/61 06/09/24 13:54  Temp 36.3 C 06/09/24 13:55  Pulse 59 06/09/24 13:57  Resp 21 06/09/24 13:57  SpO2 98 % 06/09/24 13:57  Vitals shown include unfiled device data.  Last Pain:  Vitals:   06/09/24 0940  TempSrc: Temporal  PainSc: 2          Complications: No notable events documented.

## 2024-06-09 NOTE — Progress Notes (Signed)
 Assisted Dr. Bradley Ferris with right, supraclavicular, ultrasound guided block. Side rails up, monitors on throughout procedure. See vital signs in flow sheet. Tolerated Procedure well.

## 2024-06-09 NOTE — Progress Notes (Signed)
 Orthopedic Tech Progress Note Patient Details:  Sylvia Pruitt 10/10/1956 [id number]  Patient ID: Sylvia Pruitt, female   DOB: 04-Nov-1956, 68 y.o.   MRN: [id number] RN called to inform me the ordering physician wanted a long arm that extended into the shoulder. I did explain to the RN why I stopped mid shaft of the humerus because the fracture was distal of the humerus into the elbow. I made sure there was enough coverage to mold the splint in its respected area.  Patient was not experiencing pain proximally nor was there a break in that area but the physician wanted more stability. I went back to re do the splint.    Sylvia Pruitt L Sylvia Pruitt 06/09/2024, 5:10 AM

## 2024-06-09 NOTE — Anesthesia Procedure Notes (Signed)
 Anesthesia Regional Block: Supraclavicular block   Pre-Anesthetic Checklist: , timeout performed,  Correct Patient, Correct Site, Correct Laterality,  Correct Procedure, Correct Position, site marked,  Risks and benefits discussed,  Surgical consent,  Pre-op evaluation,  At surgeon's request and post-op pain management  Laterality: Right  Prep: chloraprep       Needles:  Injection technique: Single-shot  Needle Type: Echogenic Stimulator Needle     Needle Length: 9cm  Needle Gauge: 21     Additional Needles:   Procedures:,,,, ultrasound used (permanent image in chart),,    Narrative:  Start time: 06/09/2024 10:35 AM End time: 06/09/2024 10:45 AM Injection made incrementally with aspirations every 5 mL.  Performed by: Personally  Anesthesiologist: Patrisha Bernardino SQUIBB, MD  Additional Notes: Functioning IV was confirmed and monitors were applied.  A timeout was performed. Sterile prep, hand hygiene and sterile gloves were used. A 90mm 21ga Arrow echogenic stimulator needle was used. Negative aspiration and negative test dose prior to incremental administration of local anesthetic. The patient tolerated the procedure well.  Ultrasound guidance: relevent anatomy identified, needle position confirmed, local anesthetic spread visualized around nerve(s), vascular puncture avoided.  Image printed for medical record.

## 2024-06-09 NOTE — ED Notes (Signed)
 Ortho tech at bedside

## 2024-06-09 NOTE — H&P (Signed)
 PREOPERATIVE H&P  Chief Complaint: RIGHT ELBOW FRACTURE  HPI: Sylvia Pruitt is a 68 y.o. female who is scheduled for, Procedure(s): OPEN REDUCTION INTERNAL FIXATION (ORIF) DISTAL HUMERUS FRACTURE.   Patient had a fall last night where she landed on her right elbow. She felt immediate pain. She went to [facility] ED. Xrays in the ED showed a displaced distal humerus fracture. Orthopedics was consulted.  Symptoms are rated as moderate to severe, and have been worsening.  This is significantly impairing activities of daily living.    Please see clinic note for further details on this patient's care.    She has elected for surgical management.   Past Medical History:  Diagnosis Date   Elevated lipids    Foot pain, right    Hypothyroid    No past surgical history on file. Social History   Socioeconomic History   Marital status: Single    Spouse name: Not on file   Number of children: Not on file   Years of education: Not on file   Highest education level: Not on file  Occupational History   Not on file  Tobacco Use   Smoking status: Never   Smokeless tobacco: Never  Vaping Use   Vaping status: Never Used  Substance and Sexual Activity   Alcohol use: No   Drug use: No   Sexual activity: Not on file  Other Topics Concern   Not on file  Social History Narrative   Not on file   Social Drivers of Health   Financial Resource Strain: Not on file  Food Insecurity: Not on file  Transportation Needs: Not on file  Physical Activity: Not on file  Stress: Not on file  Social Connections: Unknown (04/23/2022)   Received from [facility]   Social Network    Social Network: Not on file   Family History  Problem Relation Age of Onset   Heart disease Father    No Known Allergies Prior to Admission medications   Medication Sig Start Date End Date Taking? Authorizing Provider  gabapentin  (NEURONTIN ) 300 MG capsule TAKE 2 CAPSULES BY MOUTH FOUR TIMES DAILY 06/06/20    Levora Reyes SAUNDERS, MD  levothyroxine  (SYNTHROID ) 50 MCG tablet TAKE 1 TABLET BY MOUTH EVERY DAY 03/12/20   Levora Reyes SAUNDERS, MD  omeprazole  (PRILOSEC) 20 MG capsule Take 1 capsule (20 mg total) by mouth 2 (two) times daily before a meal. 10/10/19   Levora Reyes SAUNDERS, MD  oxyCODONE -acetaminophen  (PERCOCET/ROXICET) 5-325 MG tablet Take 1 tablet by mouth every 6 (six) hours as needed for severe pain (pain score 7-10). 06/09/24   Griselda Norris, MD    ROS: All other systems have been reviewed and were otherwise negative with the exception of those mentioned in the HPI and as above.  Physical Exam: General: Alert, no acute distress Cardiovascular: No pedal edema Respiratory: No cyanosis, no use of accessory musculature GI: No organomegaly, abdomen is soft and non-tender Skin: No lesions in the area of chief complaint Neurologic: Sensation intact distally Psychiatric: Patient is competent for consent with normal mood and affect Lymphatic: No axillary or cervical lymphadenopathy  MUSCULOSKELETAL:  Unable to perform due to telehealth visit  Imaging: Xrays demonstrate a displaced distal humerus fracture  Assessment: RIGHT ELBOW FRACTURE  Plan: Plan for Procedure(s): OPEN REDUCTION INTERNAL FIXATION (ORIF) DISTAL HUMERUS FRACTURE  The risks benefits and alternatives were discussed with the patient including but not limited to the risks of nonoperative treatment, versus surgical intervention including  infection, bleeding, nerve injury,  blood clots, cardiopulmonary complications, morbidity, mortality, among others, and they were willing to proceed.   The patient acknowledged the explanation, agreed to proceed with the plan and consent was signed.   Operative Plan: ORIF right distal humerus fracture Discharge Medications: standard DVT Prophylaxis: none Physical Therapy: outpatient PT Special Discharge needs: +/-   Aleck LOISE Stalling, PA-C  06/09/2024 9:30 AM

## 2024-06-10 ENCOUNTER — Encounter (HOSPITAL_BASED_OUTPATIENT_CLINIC_OR_DEPARTMENT_OTHER): Payer: Self-pay | Admitting: Orthopaedic Surgery

## 2024-06-10 NOTE — Op Note (Signed)
 Orthopaedic Surgery Operative Note (CSN: [id number])  [name]  [birth year] Date of Surgery: 06/09/2024   Diagnoses:  Right supracondylar intercondylar distal humerus fracture with ulnar nerve compression  Procedure: Right open reduction internal fixation of supracondylar intercondylar distal humerus fracture Ulnar nerve decompression   Operative Finding Successful completion of the planned procedure.  Patient's fracture was atypical with medial comminution and a very oblique fracture line that extends into the far lateral aspect of the articular surface.  Based on this there was extreme tension on the ulnar nerve medially and I felt that a decompression would be appropriate.  Decompress the nerve but placed it back into its normal location gently securing it with Vicryl sutures to avoid compression and subluxation.  Fracture itself was held with by columnar plating with good fixation.  The fragment laterally and distally was quite small and I was only able to get 2 screws from my lateral plate to engage however they both had good purchase.  I had to position the lateral plate somewhat atypically in order to obtain purchase with the second screw distally.  That said the construct was strong enough that I felt that the patient could use the arm for light ADLs initially.  Post-operative plan: The patient will be sling for comfort and use of the arm for ADLs at waist level.  The patient will be discharged home.  DVT prophylaxis not indicated in this ambulatory upper extremity patient without significant risk factors.   Pain control with PRN pain medication preferring oral medicines.  Follow up plan will be scheduled in approximately 7 days for incision check and XR.  Post-Op Diagnosis: Same Surgeons:Primary: Sylvia Bonner DASEN, MD Assistants:Caroline McBane, PA-C Location: [facility] OR ROOM 6 Anesthesia: General with regional anesthesia Antibiotics: Ancef  2 g with local vancomycin  powder 1 g at the  surgical site Tourniquet time:  Estimated Blood Loss: 100 Complications: None Specimens: None Implants: Implant Name Type Inv. Item Serial No. Manufacturer Lot No. LRB No. Used Action  PLATE LOCK R LG [id number] - [id number] Plate PLATE LOCK R LG [id number]  [employer]IMMER [employer]ON(ORTH,TRAU,BIO,SG)  Right 1 Implanted  SCREW LOCK CORT STAR 3.5X48 - [id number] Screw SCREW LOCK CORT STAR 3.5X48  [employer]ON(ORTH,TRAU,BIO,SG)  Right 1 Implanted  SCREW LOCK CORT STAR 3.5X50 - [id number] Screw SCREW LOCK CORT STAR 3.5X50  ZIMMER [employer]ON(ORTH,TRAU,BIO,SG)  Right 1 Implanted  SCREW LOCK CORT STAR 3.5X28 - [id number] Screw SCREW LOCK CORT STAR 3.5X28  ZIMMER [employer]ON(ORTH,TRAU,BIO,SG)  Right 1 Implanted  SCREW LOCK CORT STAR 3.5X20 - [id number] Screw SCREW LOCK CORT STAR 3.5X20  Z[employer]MER [employer]ON(ORTH,TRAU,BIO,SG)  Right 1 Implanted  SCREW LOCK CORT STAR 3.5X18 - [id number] Screw SCREW LOCK CORT STAR 3.5X18  [employer]MER [employer]ON(ORTH,TRAU,BIO,SG)  Right 2 Implanted  PLATE LOCK RT SM [id number] - [id number] Plate PLATE LOCK RT SM [id number]  [employer]MER [employer]ON(ORTH,TRAU,BIO,SG)  Right 1 Implanted  SCREW LOCK CORT STAR 3.5X16 - [id number] Screw SCREW LOCK CORT STAR 3.5X16  [employer]MER [employer]ON(ORTH,TRAU,BIO,SG)  Right 1 Implanted  SCREW LOCK CORT STAR 3.5X40 - [id number] Screw SCREW LOCK CORT STAR 3.5X40  Z[employer]MER [employer]ON(ORTH,TRAU,BIO,SG)  Right 1 Implanted  lpnl screw Screw   BIOMET ZIMMER MICROFIXATION  Right 1 Implanted  SCREW CORTICAL 3.5MM - [id number] Screw SCREW CORTICAL 3.5MM  ZIMMER RECON(ORTH,TRAU,BIO,SG)  Right 1 Implanted  SCREW CORTICAL 3.5MM - [id number] Screw SCREW CORTICAL 3.5MM  ZIMMER RECON(ORTH,TRAU,BIO,SG)  Right 1 Implanted    Indications for Surgery:   Sylvia Pruitt is a 68 y.o. female with fall  resulting in complex distal humerus fracture.  Benefits and risks of operative and nonoperative management were discussed prior to surgery with patient/guardian(s) and informed consent form was  completed.  Specific risks including infection, need for additional surgery, nonunion, malunion, hardware pain, loss of fixation amongst others.   Procedure:   The patient was identified properly. Informed consent was obtained and the surgical site was marked. The patient was taken up to suite where general anesthesia was induced.  The patient was positioned lateral on a beanbag with arm over sure foot bolster.  The right arm was prepped and draped in the usual sterile fashion.  Timeout was performed before the beginning of the case.  We began with a posterior approach to the elbow.  With the skin sharply to hemostasis we progressed.  We identified that the fracture was intra-articular however it was far lateral that is engaged the articular surface.  I felt that a dual window pair tricipital approach was appropriate and would potentially preserve the olecranon and triceps attachment.  We made our repair tricipital windows taking care on the lateral side to avoid moving too proximal and engaging the radial nerve bundle instead staying away from this area.  Medially we identified the ulnar nerve and performed a careful decompression throughout the length of our incision.  We carried this forward distally past the joint.  The ulnar nerve was under no tension.  We did not sacrifice any branches to the nerve.  We placed a vessel loop around it and protected throughout the case.  We then began to identify our fracture fragments.  While there was no obvious comminution on her preop x-rays there was clear comminution on the proximal aspect of the medial side.  This in the sharp oblique nature of the fracture meant that lag screw fixation was not quite the appropriate.  We placed a series of K wires to hold the provisional reduction.  Once we had done this we were able to use fluoroscopy to guide our placement of a medial plate.  We started medially as that was the more proximal extent of the fracture.  We obtained  multiple locking and nonlocking screws above and below the fracture site obtaining great purchase of the fragment.  The lateral plate position had to be adjusted somewhat more lateral on the distal aspect in order to obtain at least 2 screws that engage the fracture fragment.  We were able to do so with a multidirectional nonlocking screw in the posterior to anterior portion of the plate and our lateral to medial cross screw had good purchase and did not engage the articular surface.  We checked around the world fluoroscopic views to ensure that we did not perforate the surface.  Final views of the elbow demonstrated anatomic reduction and great position of the plates.  We irrigated copiously and ensured that the ulnar nerve was intact protected throughout the case.  Placed local vancomycin  powder.  Dead space on the medial and lateral.  Tricipital approach was closed.  We closed the incision in multilayer fashion finishing with nylon suture.  Sterile dressing was placed with sling.  Patient awoken taken to PACU in stable condition.  Aleck Stalling, PA-C, present and scrubbed throughout the case, critical for completion in a timely fashion, and for retraction, instrumentation, closure.

## 2024-06-16 DIAGNOSIS — S42411D Displaced simple supracondylar fracture without intercondylar fracture of right humerus, subsequent encounter for fracture with routine healing: Secondary | ICD-10-CM | POA: Diagnosis not present

## 2024-06-20 DIAGNOSIS — S42401D Unspecified fracture of lower end of right humerus, subsequent encounter for fracture with routine healing: Secondary | ICD-10-CM | POA: Diagnosis not present

## 2024-06-23 DIAGNOSIS — S42401D Unspecified fracture of lower end of right humerus, subsequent encounter for fracture with routine healing: Secondary | ICD-10-CM | POA: Diagnosis not present

## 2024-06-27 DIAGNOSIS — S42401D Unspecified fracture of lower end of right humerus, subsequent encounter for fracture with routine healing: Secondary | ICD-10-CM | POA: Diagnosis not present

## 2024-06-28 DIAGNOSIS — G629 Polyneuropathy, unspecified: Secondary | ICD-10-CM | POA: Diagnosis not present

## 2024-06-28 DIAGNOSIS — E7800 Pure hypercholesterolemia, unspecified: Secondary | ICD-10-CM | POA: Diagnosis not present

## 2024-06-28 DIAGNOSIS — Z0000 Encounter for general adult medical examination without abnormal findings: Secondary | ICD-10-CM | POA: Diagnosis not present

## 2024-06-28 DIAGNOSIS — Z1331 Encounter for screening for depression: Secondary | ICD-10-CM | POA: Diagnosis not present

## 2024-06-28 DIAGNOSIS — Z1211 Encounter for screening for malignant neoplasm of colon: Secondary | ICD-10-CM | POA: Diagnosis not present

## 2024-06-28 DIAGNOSIS — M810 Age-related osteoporosis without current pathological fracture: Secondary | ICD-10-CM | POA: Diagnosis not present

## 2024-06-28 DIAGNOSIS — K219 Gastro-esophageal reflux disease without esophagitis: Secondary | ICD-10-CM | POA: Diagnosis not present

## 2024-06-28 DIAGNOSIS — E039 Hypothyroidism, unspecified: Secondary | ICD-10-CM | POA: Diagnosis not present

## 2024-06-28 DIAGNOSIS — S42401D Unspecified fracture of lower end of right humerus, subsequent encounter for fracture with routine healing: Secondary | ICD-10-CM | POA: Diagnosis not present

## 2024-06-29 DIAGNOSIS — S42401D Unspecified fracture of lower end of right humerus, subsequent encounter for fracture with routine healing: Secondary | ICD-10-CM | POA: Diagnosis not present

## 2024-07-04 DIAGNOSIS — S42401D Unspecified fracture of lower end of right humerus, subsequent encounter for fracture with routine healing: Secondary | ICD-10-CM | POA: Diagnosis not present

## 2024-07-07 DIAGNOSIS — S42401D Unspecified fracture of lower end of right humerus, subsequent encounter for fracture with routine healing: Secondary | ICD-10-CM | POA: Diagnosis not present

## 2024-07-11 DIAGNOSIS — S42401D Unspecified fracture of lower end of right humerus, subsequent encounter for fracture with routine healing: Secondary | ICD-10-CM | POA: Diagnosis not present

## 2024-07-18 DIAGNOSIS — S42401D Unspecified fracture of lower end of right humerus, subsequent encounter for fracture with routine healing: Secondary | ICD-10-CM | POA: Diagnosis not present

## 2024-07-21 DIAGNOSIS — S42401D Unspecified fracture of lower end of right humerus, subsequent encounter for fracture with routine healing: Secondary | ICD-10-CM | POA: Diagnosis not present

## 2024-07-25 DIAGNOSIS — S42401D Unspecified fracture of lower end of right humerus, subsequent encounter for fracture with routine healing: Secondary | ICD-10-CM | POA: Diagnosis not present

## 2024-07-26 DIAGNOSIS — M85851 Other specified disorders of bone density and structure, right thigh: Secondary | ICD-10-CM | POA: Diagnosis not present

## 2024-07-26 DIAGNOSIS — M810 Age-related osteoporosis without current pathological fracture: Secondary | ICD-10-CM | POA: Diagnosis not present

## 2024-07-26 DIAGNOSIS — Z1231 Encounter for screening mammogram for malignant neoplasm of breast: Secondary | ICD-10-CM | POA: Diagnosis not present

## 2024-07-26 DIAGNOSIS — R92323 Mammographic fibroglandular density, bilateral breasts: Secondary | ICD-10-CM | POA: Diagnosis not present

## 2024-07-28 DIAGNOSIS — Z1211 Encounter for screening for malignant neoplasm of colon: Secondary | ICD-10-CM | POA: Diagnosis not present

## 2024-07-28 DIAGNOSIS — S42401D Unspecified fracture of lower end of right humerus, subsequent encounter for fracture with routine healing: Secondary | ICD-10-CM | POA: Diagnosis not present

## 2024-08-01 DIAGNOSIS — S42401D Unspecified fracture of lower end of right humerus, subsequent encounter for fracture with routine healing: Secondary | ICD-10-CM | POA: Diagnosis not present

## 2024-08-04 DIAGNOSIS — S42401D Unspecified fracture of lower end of right humerus, subsequent encounter for fracture with routine healing: Secondary | ICD-10-CM | POA: Diagnosis not present

## 2024-08-05 DIAGNOSIS — S42401D Unspecified fracture of lower end of right humerus, subsequent encounter for fracture with routine healing: Secondary | ICD-10-CM | POA: Diagnosis not present

## 2024-08-08 DIAGNOSIS — S42401D Unspecified fracture of lower end of right humerus, subsequent encounter for fracture with routine healing: Secondary | ICD-10-CM | POA: Diagnosis not present

## 2024-08-11 DIAGNOSIS — S42401D Unspecified fracture of lower end of right humerus, subsequent encounter for fracture with routine healing: Secondary | ICD-10-CM | POA: Diagnosis not present

## 2024-08-17 DIAGNOSIS — S42401D Unspecified fracture of lower end of right humerus, subsequent encounter for fracture with routine healing: Secondary | ICD-10-CM | POA: Diagnosis not present

## 2024-08-25 DIAGNOSIS — S42401D Unspecified fracture of lower end of right humerus, subsequent encounter for fracture with routine healing: Secondary | ICD-10-CM | POA: Diagnosis not present

## 2024-09-01 DIAGNOSIS — S42401D Unspecified fracture of lower end of right humerus, subsequent encounter for fracture with routine healing: Secondary | ICD-10-CM | POA: Diagnosis not present

## 2024-09-06 DIAGNOSIS — S42401D Unspecified fracture of lower end of right humerus, subsequent encounter for fracture with routine healing: Secondary | ICD-10-CM | POA: Diagnosis not present

## 2024-09-08 DIAGNOSIS — S42401D Unspecified fracture of lower end of right humerus, subsequent encounter for fracture with routine healing: Secondary | ICD-10-CM | POA: Diagnosis not present

## 2024-09-14 DIAGNOSIS — S42401D Unspecified fracture of lower end of right humerus, subsequent encounter for fracture with routine healing: Secondary | ICD-10-CM | POA: Diagnosis not present

## 2024-09-20 DIAGNOSIS — S42401D Unspecified fracture of lower end of right humerus, subsequent encounter for fracture with routine healing: Secondary | ICD-10-CM | POA: Diagnosis not present

## 2024-09-27 DIAGNOSIS — S42401D Unspecified fracture of lower end of right humerus, subsequent encounter for fracture with routine healing: Secondary | ICD-10-CM | POA: Diagnosis not present

## 2024-10-06 DIAGNOSIS — S42401D Unspecified fracture of lower end of right humerus, subsequent encounter for fracture with routine healing: Secondary | ICD-10-CM | POA: Diagnosis not present

## 2024-10-08 DIAGNOSIS — M25511 Pain in right shoulder: Secondary | ICD-10-CM | POA: Diagnosis not present

## 2024-10-13 DIAGNOSIS — S42401D Unspecified fracture of lower end of right humerus, subsequent encounter for fracture with routine healing: Secondary | ICD-10-CM | POA: Diagnosis not present

## 2024-10-13 DIAGNOSIS — M19011 Primary osteoarthritis, right shoulder: Secondary | ICD-10-CM | POA: Diagnosis not present

## 2024-10-13 DIAGNOSIS — M7521 Bicipital tendinitis, right shoulder: Secondary | ICD-10-CM | POA: Diagnosis not present

## 2024-10-13 DIAGNOSIS — M75111 Incomplete rotator cuff tear or rupture of right shoulder, not specified as traumatic: Secondary | ICD-10-CM | POA: Diagnosis not present

## 2024-10-13 DIAGNOSIS — S46011A Strain of muscle(s) and tendon(s) of the rotator cuff of right shoulder, initial encounter: Secondary | ICD-10-CM | POA: Diagnosis not present

## 2024-10-24 DIAGNOSIS — M6281 Muscle weakness (generalized): Secondary | ICD-10-CM | POA: Diagnosis not present

## 2024-10-24 DIAGNOSIS — S46011D Strain of muscle(s) and tendon(s) of the rotator cuff of right shoulder, subsequent encounter: Secondary | ICD-10-CM | POA: Diagnosis not present

## 2024-10-24 DIAGNOSIS — M25611 Stiffness of right shoulder, not elsewhere classified: Secondary | ICD-10-CM | POA: Diagnosis not present

## 2024-11-01 DIAGNOSIS — M6281 Muscle weakness (generalized): Secondary | ICD-10-CM | POA: Diagnosis not present

## 2024-11-01 DIAGNOSIS — S46011D Strain of muscle(s) and tendon(s) of the rotator cuff of right shoulder, subsequent encounter: Secondary | ICD-10-CM | POA: Diagnosis not present

## 2024-11-01 DIAGNOSIS — M25611 Stiffness of right shoulder, not elsewhere classified: Secondary | ICD-10-CM | POA: Diagnosis not present

## 2024-11-08 DIAGNOSIS — S46011D Strain of muscle(s) and tendon(s) of the rotator cuff of right shoulder, subsequent encounter: Secondary | ICD-10-CM | POA: Diagnosis not present

## 2024-11-08 DIAGNOSIS — M25611 Stiffness of right shoulder, not elsewhere classified: Secondary | ICD-10-CM | POA: Diagnosis not present

## 2024-11-08 DIAGNOSIS — M6281 Muscle weakness (generalized): Secondary | ICD-10-CM | POA: Diagnosis not present

## 2024-11-15 DIAGNOSIS — M25611 Stiffness of right shoulder, not elsewhere classified: Secondary | ICD-10-CM | POA: Diagnosis not present

## 2024-11-15 DIAGNOSIS — S46011D Strain of muscle(s) and tendon(s) of the rotator cuff of right shoulder, subsequent encounter: Secondary | ICD-10-CM | POA: Diagnosis not present

## 2024-11-15 DIAGNOSIS — M6281 Muscle weakness (generalized): Secondary | ICD-10-CM | POA: Diagnosis not present

## 2024-11-22 DIAGNOSIS — L821 Other seborrheic keratosis: Secondary | ICD-10-CM | POA: Diagnosis not present

## 2024-11-22 DIAGNOSIS — S46011D Strain of muscle(s) and tendon(s) of the rotator cuff of right shoulder, subsequent encounter: Secondary | ICD-10-CM | POA: Diagnosis not present

## 2024-11-22 DIAGNOSIS — M25611 Stiffness of right shoulder, not elsewhere classified: Secondary | ICD-10-CM | POA: Diagnosis not present

## 2024-11-22 DIAGNOSIS — M6281 Muscle weakness (generalized): Secondary | ICD-10-CM | POA: Diagnosis not present

## 2024-11-22 DIAGNOSIS — Z85828 Personal history of other malignant neoplasm of skin: Secondary | ICD-10-CM | POA: Diagnosis not present
